# Patient Record
Sex: Female | Born: 1990 | Race: Black or African American | Hispanic: No | Marital: Married | Smoking: Never smoker
Health system: Southern US, Community
[De-identification: ages and names within clinical notes are randomized; demographics above are authoritative.]

## PROBLEM LIST (undated history)

## (undated) HISTORY — PX: NO PAST SURGERIES: SHX2092

---

## 2014-03-18 DIAGNOSIS — B681 Taenia saginata taeniasis: Secondary | ICD-10-CM

## 2014-03-18 HISTORY — DX: Taenia saginata taeniasis: B68.1

## 2014-05-23 ENCOUNTER — Ambulatory Visit: Payer: Medicaid Other | Attending: Family Medicine | Admitting: Family Medicine

## 2014-05-23 ENCOUNTER — Encounter: Payer: Self-pay | Admitting: Family Medicine

## 2014-05-23 VITALS — BP 112/78 | HR 82 | Temp 98.1°F | Resp 16 | Ht 61.0 in | Wt 114.0 lb

## 2014-05-23 DIAGNOSIS — K644 Residual hemorrhoidal skin tags: Secondary | ICD-10-CM | POA: Diagnosis not present

## 2014-05-23 DIAGNOSIS — R51 Headache: Secondary | ICD-10-CM | POA: Insufficient documentation

## 2014-05-23 DIAGNOSIS — Z124 Encounter for screening for malignant neoplasm of cervix: Secondary | ICD-10-CM

## 2014-05-23 DIAGNOSIS — B3749 Other urogenital candidiasis: Secondary | ICD-10-CM | POA: Diagnosis not present

## 2014-05-23 DIAGNOSIS — L03011 Cellulitis of right finger: Secondary | ICD-10-CM | POA: Diagnosis not present

## 2014-05-23 DIAGNOSIS — E559 Vitamin D deficiency, unspecified: Secondary | ICD-10-CM | POA: Insufficient documentation

## 2014-05-23 DIAGNOSIS — J309 Allergic rhinitis, unspecified: Secondary | ICD-10-CM | POA: Diagnosis not present

## 2014-05-23 DIAGNOSIS — A499 Bacterial infection, unspecified: Secondary | ICD-10-CM

## 2014-05-23 DIAGNOSIS — K648 Other hemorrhoids: Secondary | ICD-10-CM

## 2014-05-23 DIAGNOSIS — R42 Dizziness and giddiness: Secondary | ICD-10-CM | POA: Insufficient documentation

## 2014-05-23 DIAGNOSIS — R1030 Lower abdominal pain, unspecified: Secondary | ICD-10-CM

## 2014-05-23 DIAGNOSIS — B9689 Other specified bacterial agents as the cause of diseases classified elsewhere: Secondary | ICD-10-CM

## 2014-05-23 DIAGNOSIS — Z0000 Encounter for general adult medical examination without abnormal findings: Secondary | ICD-10-CM | POA: Diagnosis not present

## 2014-05-23 DIAGNOSIS — N760 Acute vaginitis: Secondary | ICD-10-CM

## 2014-05-23 DIAGNOSIS — Z114 Encounter for screening for human immunodeficiency virus [HIV]: Secondary | ICD-10-CM

## 2014-05-23 DIAGNOSIS — N920 Excessive and frequent menstruation with regular cycle: Secondary | ICD-10-CM | POA: Diagnosis not present

## 2014-05-23 LAB — POCT URINE PREGNANCY: PREG TEST UR: NEGATIVE

## 2014-05-23 MED ORDER — MOMETASONE FUROATE 0.1 % EX OINT: TOPICAL_OINTMENT | Freq: Every day | CUTANEOUS | Status: DC

## 2014-05-23 MED ORDER — MOMETASONE FUROATE 0.1 % EX CREA: 1.0000 "application " | TOPICAL_CREAM | Freq: Every day | CUTANEOUS | Status: DC

## 2014-05-23 MED ORDER — FLUTICASONE PROPIONATE 50 MCG/ACT NA SUSP: 2.0000 | Freq: Every day | NASAL | Status: DC

## 2014-05-23 MED ORDER — NAPROXEN 500 MG PO TABS: 500.0000 mg | ORAL_TABLET | Freq: Two times a day (BID) | ORAL | Status: DC

## 2014-05-23 NOTE — Assessment & Plan Note (Signed)
External hemorrhoid: referral to general surgery for removal.

## 2014-05-23 NOTE — Assessment & Plan Note (Signed)
Chronic paronychia: swelling along nail bed of R thumb and middle finger.  Keep hands as dry as possible and to use gloves for all wet work Topical corticosteroid-

## 2014-05-23 NOTE — Assessment & Plan Note (Addendum)
Dizziness and headaches: I suspect low vit D. Checking level.

## 2014-05-23 NOTE — Assessment & Plan Note (Signed)
Swollen nares consistent with allergic rhinitis: flonase prescribed.

## 2014-05-23 NOTE — Progress Notes (Signed)
   Subjective:    Patient ID: Rachel Lucero, female    DOB: 10/12/1990, 24 y.o.   MRN: [id number] CC: establish care, wellness visit  HPI 24 yo F: Tig[place] interpreter on the phone.   Soc Hx: non smoker OB Hx: G1P1 daughter is 3 yrs and 736 months old Surg hx: negative  Review of Systems  General:  Negative for nexplained weight loss, fever Skin: Negative for new or changing mole, sore that won't heal HEENT: Negative for trouble hearing, trouble seeing, ringing in ears, mouth sores, hoarseness, change in voice, dysphagia. CV:  Negative for chest pain, dyspnea, edema, palpitations Resp: Negative for cough, dyspnea, hemoptysis GI: Positive for abdominal pain with urination during menses only. Negative for nausea, vomiting, diarrhea, constipation, abdominal pain, melena, hematochezia. GU: Negative for dysuria, incontinence, urinary hesitance, hematuria, vaginal or penile discharge, polyuria, sexual difficulty, lumps in testicle or breasts MSK: Positive for low back pains during menses. Negative for muscle cramps or aches, joint pain or swelling Neuro: Positive for dizziness. Negative for headaches, weakness, numbness,  passing out/fainting Psych: Negative for depression, anxiety, memory problems    Objective:   Physical Exam BP 112/78 mmHg  Pulse 82  Temp(Src) 98.1 F (36.7 C) (Oral)  Resp 16  Ht 5\' 1"  (1.549 m)  Wt 114 lb (51.71 kg)  BMI 21.55 kg/m2  SpO2 100%  LMP 04/24/2014  General Appearance:    Alert, cooperative, no distress, appears stated age  Head:    Normocephalic, without obvious abnormality, atraumatic  Eyes:    PERRL, conjunctiva/corneas clear, EOM's intact,    both eyes  Ears:    Normal TM's and external ear canals, both ears  Nose:   Nares normal, septum midline, mucosa normal, no drainage    or sinus tenderness  Throat:   Lips, mucosa, and tongue normal; teeth and gums normal  Neck:   Supple, symmetrical, trachea midline, no adenopathy;    thyroid:  no  enlargement/tenderness/nodules.   Back:     Symmetric, no curvature, ROM normal, no CVA tenderness  Lungs:     Clear to auscultation bilaterally, respirations unlabored  Chest Wall:    No tenderness or deformity   Heart:    Regular rate and rhythm, S1 and S2 normal, no murmur, rub   or gallop  Breast Exam:    No tenderness, masses, or nipple abnormality  Abdomen:     Soft, non-tender, bowel sounds active all four quadrants,    no masses, no organomegaly  Genitalia:    Normal female without lesion, tenderness. Scan bloody discharge   Rectal:    External hemorrhoid, non thrombosed.   Extremities:   Extremities normal, atraumatic, no cyanosis or edema. Soft tissue swelling around nail bed of thumb and middle finger of R hand.   Pulses:   2+ and symmetric all extremities  Skin:   Skin color, texture, turgor normal, no rashes or lesions  Lymph nodes:   Cervical, supraclavicular, and axillary nodes normal  Neurologic:   CNII-XII intact, normal strength, sensation and reflexes    throughout  U preg: negative UA: large Hgb (patient with menstrual period starting soon)    Assessment & Plan:

## 2014-05-23 NOTE — Assessment & Plan Note (Signed)
Pap done today  

## 2014-05-23 NOTE — Patient Instructions (Addendum)
Rachel Lucero,  Thank you for coming in today. It was a pleasure meeting you. I look forward to being your primary doctor.   1. Pain with menses: take naproxen as prescribed  2. Dizziness and headaches: I suspect low vit D. Checking level.  3. Swollen nares consistent with allergic rhinitis: flonase prescribed.  4. Chronic paronychia: swelling along nail bed of R thumb and middle finger.  Keep hands as dry as possible and to use gloves for all wet work Topical corticosteroid-   5. External hemorrhoid: referral to general surgery for removal.   F/u in 3 months for dizziness and chronic paronychia  Dr. Armen PickupFunches   Paronychia  Paronychia is an infection of the skin caused by germs. It happens by the fingernail or toenail. You can avoid it by not:  Pulling on hangnails.  Nail biting.  Thumb sucking.  Cutting fingernails and toenails too short.  Cutting the skin at the base and sides of the fingernail or toenail (cuticle). HOME CARE  Keep the fingers or toes very dry. Put rubber gloves over cotton gloves when putting hands in water.  Keep the wound clean and bandaged (dressed) as told by your doctor.  Soak the fingers or toes in warm water for 15 to 20 minutes. Soak them 3 to 4 times per day for germ infections. Fungal infections are difficult to treat. Fungal infections often require treatment for a long time.  Only take medicine as told by your doctor. GET HELP RIGHT AWAY IF:   You have redness, puffiness (swelling), or pain that gets worse.  You see yellowish-white fluid (pus) coming from the wound.  You have a fever.  You have a bad smell coming from the wound or bandage. MAKE SURE YOU:  Understand these instructions.  Will watch your condition.  Will get help if you are not doing well or get worse. Document Released: 02/20/2009 Document Revised: 05/27/2011 Document Reviewed: 02/20/2009 [facility] Patient Information 2015 [employer] [place]. This information  is not intended to replace advice given to you by your health care provider. Make sure you discuss any questions you have with your health care provider.

## 2014-05-23 NOTE — Assessment & Plan Note (Signed)
Screening HIV ordered  

## 2014-05-23 NOTE — Progress Notes (Signed)
Used [employer] [place] [phone]#[id number] Establish Care Complaining of Dizziness, abdominal pain and pain with urination. Vaginal discharge with out odor. Sx stated 8 month ago No sexually active

## 2014-05-23 NOTE — Assessment & Plan Note (Signed)
Pain with menses: take naproxen as prescribed

## 2014-05-24 DIAGNOSIS — E559 Vitamin D deficiency, unspecified: Secondary | ICD-10-CM | POA: Insufficient documentation

## 2014-05-24 DIAGNOSIS — N760 Acute vaginitis: Secondary | ICD-10-CM

## 2014-05-24 DIAGNOSIS — B9689 Other specified bacterial agents as the cause of diseases classified elsewhere: Secondary | ICD-10-CM | POA: Insufficient documentation

## 2014-05-24 LAB — CBC
HEMATOCRIT: 42.4 % (ref 36.0–46.0)
HEMOGLOBIN: 14 g/dL (ref 12.0–15.0)
MCH: 29.5 pg (ref 26.0–34.0)
MCHC: 33 g/dL (ref 30.0–36.0)
MCV: 89.3 fL (ref 78.0–100.0)
MPV: 11.1 fL (ref 8.6–12.4)
Platelets: 231 10*3/uL (ref 150–400)
RBC: 4.75 MIL/uL (ref 3.87–5.11)
RDW: 13.1 % (ref 11.5–15.5)
WBC: 3.3 10*3/uL — AB (ref 4.0–10.5)

## 2014-05-24 LAB — COMPLETE METABOLIC PANEL WITH GFR
ALBUMIN: 4.3 g/dL (ref 3.5–5.2)
ALK PHOS: 76 U/L (ref 39–117)
ALT: 8 U/L (ref 0–35)
AST: 13 U/L (ref 0–37)
BILIRUBIN TOTAL: 0.6 mg/dL (ref 0.2–1.2)
BUN: 12 mg/dL (ref 6–23)
CO2: 23 mEq/L (ref 19–32)
Calcium: 9.2 mg/dL (ref 8.4–10.5)
Chloride: 104 mEq/L (ref 96–112)
Creat: 0.53 mg/dL (ref 0.50–1.10)
GFR, Est African American: >89 mL/min
GLUCOSE: 81 mg/dL (ref 70–99)
POTASSIUM: 4.3 meq/L (ref 3.5–5.3)
Sodium: 138 mEq/L (ref 135–145)
Total Protein: 7.5 g/dL (ref 6.0–8.3)

## 2014-05-24 LAB — CYTOLOGY - PAP

## 2014-05-24 LAB — CERVICOVAGINAL ANCILLARY ONLY
Chlamydia: NEGATIVE
Neisseria Gonorrhea: NEGATIVE
WET PREP (BD AFFIRM): NEGATIVE
WET PREP (BD AFFIRM): POSITIVE — AB
Wet Prep (BD Affirm): NEGATIVE

## 2014-05-24 LAB — HIV ANTIBODY (ROUTINE TESTING W REFLEX): HIV 1&2 Ab, 4th Generation: NONREACTIVE

## 2014-05-24 LAB — VITAMIN D 25 HYDROXY (VIT D DEFICIENCY, FRACTURES): Vit D, 25-Hydroxy: 14 ng/mL — ABNORMAL LOW (ref 30–100)

## 2014-05-24 LAB — TSH: TSH: 0.932 u[IU]/mL (ref 0.350–4.500)

## 2014-05-24 MED ORDER — METRONIDAZOLE 500 MG PO TABS: 500.0000 mg | ORAL_TABLET | Freq: Two times a day (BID) | ORAL | Status: DC

## 2014-05-24 MED ORDER — VITAMIN D (ERGOCALCIFEROL) 1.25 MG (50000 UNIT) PO CAPS: 50000.0000 [IU] | ORAL_CAPSULE | ORAL | Status: DC

## 2014-05-24 MED ORDER — FLUCONAZOLE 150 MG PO TABS: 150.0000 mg | ORAL_TABLET | Freq: Once | ORAL | Status: DC

## 2014-05-24 NOTE — Addendum Note (Signed)
Addended by: Dessa PhiFUNCHES, Erynne Kealey on: 05/24/2014 03:53 PM   Modules accepted: Orders

## 2014-05-24 NOTE — Assessment & Plan Note (Signed)
A: vit D deficiency P: oral vit D replacement

## 2014-05-24 NOTE — Addendum Note (Signed)
Addended by: Dessa PhiFUNCHES, Ashely Goosby on: 05/24/2014 08:27 AM   Modules accepted: Orders

## 2014-05-24 NOTE — Assessment & Plan Note (Signed)
A: BV on wet prep P: treat with flagyl and diflucan

## 2014-05-25 LAB — URINE CULTURE
Colony Count: NO GROWTH
Organism ID, Bacteria: NO GROWTH

## 2014-05-26 ENCOUNTER — Telehealth: Payer: Self-pay | Admitting: *Deleted

## 2014-05-26 NOTE — Telephone Encounter (Signed)
-----   Message from Dessa PhiJosalyn Funches, MD sent at 05/24/2014  5:02 PM EST ----- Negative pap, repeat in 3 years

## 2014-05-26 NOTE — Telephone Encounter (Signed)
[employer] [place] [phone]#[id number] Unable to contact pt. Mail box is full

## 2014-05-26 NOTE — Telephone Encounter (Signed)
-----   Message from Dessa PhiJosalyn Funches, MD sent at 05/24/2014  8:25 AM EST ----- Vitamin D deficiency as suspected. Will replace.  Normal CBC, CMP, TSH.  Screening HIV negative.

## 2014-05-26 NOTE — Telephone Encounter (Signed)
-----   Message from Dessa PhiJosalyn Funches, MD sent at 05/24/2014  3:51 PM EST ----- BV on wet prep, otherwise negative.  Will treat.

## 2014-05-31 ENCOUNTER — Other Ambulatory Visit: Payer: Self-pay | Admitting: Infectious Disease

## 2014-05-31 ENCOUNTER — Ambulatory Visit
Admission: RE | Admit: 2014-05-31 | Discharge: 2014-05-31 | Disposition: A | Discharge status: Home / Self Care | Payer: No Typology Code available for payment source | Source: Ambulatory Visit | Attending: Infectious Disease | Admitting: Infectious Disease

## 2014-05-31 DIAGNOSIS — Z139 Encounter for screening, unspecified: Secondary | ICD-10-CM

## 2014-08-02 ENCOUNTER — Other Ambulatory Visit: Payer: Self-pay | Admitting: Surgery

## 2014-08-04 ENCOUNTER — Encounter: Payer: Self-pay | Admitting: Internal Medicine

## 2014-08-04 ENCOUNTER — Ambulatory Visit (INDEPENDENT_AMBULATORY_CARE_PROVIDER_SITE_OTHER): Payer: Medicaid Other | Admitting: Internal Medicine

## 2014-08-04 ENCOUNTER — Telehealth: Payer: Self-pay | Admitting: *Deleted

## 2014-08-04 VITALS — BP 108/74 | HR 83 | Temp 97.9°F | Ht 61.0 in | Wt 110.0 lb

## 2014-08-04 DIAGNOSIS — B681 Taenia saginata taeniasis: Secondary | ICD-10-CM | POA: Diagnosis not present

## 2014-08-04 DIAGNOSIS — R7611 Nonspecific reaction to tuberculin skin test without active tuberculosis: Secondary | ICD-10-CM

## 2014-08-04 DIAGNOSIS — Z227 Latent tuberculosis: Secondary | ICD-10-CM

## 2014-08-04 LAB — CBC WITH DIFFERENTIAL/PLATELET
Basophils Absolute: 0 10*3/uL (ref 0.0–0.1)
Basophils Relative: 0 % (ref 0–1)
Eosinophils Absolute: 0.1 10*3/uL (ref 0.0–0.7)
Eosinophils Relative: 2 % (ref 0–5)
HCT: 39.6 % (ref 36.0–46.0)
Hemoglobin: 13.2 g/dL (ref 12.0–15.0)
LYMPHS ABS: 1.5 10*3/uL (ref 0.7–4.0)
LYMPHS PCT: 47 % — AB (ref 12–46)
MCH: 28.8 pg (ref 26.0–34.0)
MCHC: 33.3 g/dL (ref 30.0–36.0)
MCV: 86.3 fL (ref 78.0–100.0)
MPV: 10.4 fL (ref 8.6–12.4)
Monocytes Absolute: 0.4 10*3/uL (ref 0.1–1.0)
Monocytes Relative: 14 % — ABNORMAL HIGH (ref 3–12)
NEUTROS ABS: 1.1 10*3/uL — AB (ref 1.7–7.7)
NEUTROS PCT: 37 % — AB (ref 43–77)
Platelets: 243 10*3/uL (ref 150–400)
RBC: 4.59 MIL/uL (ref 3.87–5.11)
RDW: 13.5 % (ref 11.5–15.5)
WBC: 3.1 10*3/uL — AB (ref 4.0–10.5)

## 2014-08-04 LAB — COMPREHENSIVE METABOLIC PANEL
ALT: 15 U/L (ref 0–35)
AST: 17 U/L (ref 0–37)
Albumin: 4 g/dL (ref 3.5–5.2)
Alkaline Phosphatase: 84 U/L (ref 39–117)
BILIRUBIN TOTAL: 0.9 mg/dL (ref 0.2–1.2)
BUN: 10 mg/dL (ref 6–23)
CALCIUM: 8.9 mg/dL (ref 8.4–10.5)
CHLORIDE: 104 meq/L (ref 96–112)
CO2: 24 meq/L (ref 19–32)
Creat: 0.51 mg/dL (ref 0.50–1.10)
Glucose, Bld: 89 mg/dL (ref 70–99)
Potassium: 4.3 mEq/L (ref 3.5–5.3)
SODIUM: 138 meq/L (ref 135–145)
Total Protein: 7.2 g/dL (ref 6.0–8.3)

## 2014-08-04 MED ORDER — RIFAMPIN 300 MG PO CAPS: 600.0000 mg | ORAL_CAPSULE | Freq: Every day | ORAL | Status: DC

## 2014-08-04 MED ORDER — PRAZIQUANTEL 600 MG PO TABS: 600.0000 mg | ORAL_TABLET | Freq: Once | ORAL | Status: DC

## 2014-08-04 NOTE — Telephone Encounter (Signed)
-----   Message from Gardiner Barefootobert W Comer, MD sent at 08/03/2014  4:05 PM EDT ----- This patient needs to come in for evaluation/treatment of her worm (new pt).  Can you see if she can come in tomorrow at the end of my morning clinic.  She will need the interpreter phone I think.  If not, next week with Drue SecondSnider sometime.  thanks

## 2014-08-04 NOTE — Telephone Encounter (Signed)
Called patient through [employer] [phone] Diamantina Providence([place]) and offered patient appointment today at 11:30 AM. She wrote down the address to give to the bus driver. She accepted appointment; she said she would need to take 2 buses to get here. Told patient we can give her a bus pass to get home. Wendall MolaJacqueline Darcie Mellone CMA

## 2014-08-04 NOTE — Progress Notes (Signed)
   Subjective:    Patient ID: Rachel Lucero, female    DOB: 11/27/1990, 24 y.o.   MRN: [id number]  HPI She comes in for evaluation as a new patient. She was recently seen by surgery, Dr. gross, who was evaluating her for hemorrhoidectomy. At the time, he noted a large parasitic worm coming out of her rectum. 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  She has had no diarrhea, no rash, no weight loss. She does not recall any history of parasites. 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 She has no complaints today.   Review of Systems  Constitutional: Negative for fever, chills, activity change and appetite change.  HENT: Negative for trouble swallowing.   Eyes: Negative for visual disturbance.  Cardiovascular: Negative for chest pain.  Gastrointestinal: Negative for nausea, abdominal pain and diarrhea.  Endocrine: Negative for polyuria.  Genitourinary: Negative for vaginal pain.  Skin: Negative for rash.  Neurological: Negative for dizziness, light-headedness and headaches.       Objective:   Physical Exam  Constitutional: She appears well-developed and well-nourished. No distress.  HENT:  Mouth/Throat: No oropharyngeal exudate.  Eyes: No scleral icterus.  Cardiovascular: Normal rate, regular rhythm and normal heart sounds.   No murmur heard. Pulmonary/Chest: Effort normal and breath sounds normal. No respiratory distress. She has no wheezes.  Abdominal: Soft. Bowel sounds are normal. She exhibits no distension. There is no tenderness.  Musculoskeletal: She exhibits no edema.  Lymphadenopathy:    She has no cervical adenopathy.    She has no axillary adenopathy.       Right: No supraclavicular adenopathy present.       Left: No supraclavicular adenopathy present.  Skin: No rash noted.  Psychiatric: She has a normal mood and affect.          Assessment & Plan:

## 2014-08-04 NOTE — Assessment & Plan Note (Signed)
I will have her check (parasites for better identification of this and other parasites. She can then start the medication with praziquantel 1 dose.  I will also check for eosinophilia and strongyloidiasis and for concern for helminths.  I will have her return in 2 weeks.

## 2014-08-06 LAB — STRONGYLOIDES ANTIBODY: Strongyloides IgG Antibody, ELISA: NEGATIVE

## 2014-08-10 ENCOUNTER — Telehealth: Payer: Self-pay | Admitting: *Deleted

## 2014-08-10 ENCOUNTER — Other Ambulatory Visit: Payer: No Typology Code available for payment source

## 2014-08-10 DIAGNOSIS — B681 Taenia saginata taeniasis: Secondary | ICD-10-CM

## 2014-08-10 NOTE — Telephone Encounter (Signed)
Was not able to reach patient until today and she was actually on the way to drop off the stool sample. Advised to bring it in and to continue to take the medication that was prescribed. Per Dr. Luciana Axeomer if she had already gotten the stool sample it would be processed. Rachel Lucero

## 2014-08-10 NOTE — Telephone Encounter (Signed)
-----   Message from Gardiner Barefootobert W Comer, MD sent at 08/09/2014 11:34 AM EDT ----- Please call and she does not need to do O and P since once was done by the surgeon and is positive.  She should go ahead and take the praziquantel.  thanks

## 2014-08-11 LAB — OVA AND PARASITE EXAMINATION

## 2014-08-25 ENCOUNTER — Ambulatory Visit (INDEPENDENT_AMBULATORY_CARE_PROVIDER_SITE_OTHER): Payer: Medicaid Other | Admitting: Internal Medicine

## 2014-08-25 ENCOUNTER — Encounter: Payer: Self-pay | Admitting: Internal Medicine

## 2014-08-25 VITALS — BP 90/58 | HR 70 | Temp 98.1°F | Wt 109.0 lb

## 2014-08-25 DIAGNOSIS — B681 Taenia saginata taeniasis: Secondary | ICD-10-CM | POA: Diagnosis present

## 2014-08-25 NOTE — Assessment & Plan Note (Signed)
He has been treated and can proceed with her surgery again now. She can return on a when necessary basis.

## 2014-08-25 NOTE — Progress Notes (Signed)
   Subjective:    Patient ID: Rachel Lucero, female    DOB: 1990-04-04, 24 y.o.   MRN: [id number]  HPI She is here for follow-up of tapeworm. 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 She was given treatment treatment with this with praziquantel which she took and tolerated well. No issues now. No fever no chills.   Review of Systems  Constitutional: Negative for fever, chills and fatigue.  Gastrointestinal: Negative for nausea and diarrhea.  Skin: Negative for rash.       Objective:   Physical Exam  Constitutional: She appears well-developed and well-nourished. No distress.          Assessment & Plan:

## 2014-08-26 ENCOUNTER — Other Ambulatory Visit: Payer: Self-pay | Admitting: Surgery

## 2014-08-26 NOTE — H&P (Signed)
Rachel Lucero 08/02/2014 2:19 PM Location: [facility] Patient #: [id number] DOB: [birth year] Single / Language: Shon Hough / Race: Black or African American Female  Patient Care Team: Dessa Phi, MD as PCP - General (Family Medicine)   History of Present Illness Rachel Sportsman MD; 08/02/2014 5:57 PM) The patient is a 24 year old female who presents with hemorrhoids. Patient sent by her primary care physician with complaints of full external hemorrhoid. She is from [place] in [place]. Speaks Tigirian only. Interpreter here. Patient notes a 3 year history of pain and swelling around the anus. Thinks that it is a hemorrhoid. Especially worse with spicy more intense food. She has a bowel movement every 3 days. She's never had any prior surgery or intervention. Not really tried any medications. Denies any history of Crohn's or ulcerative colitis. No history of bowel issues. Never had any prior surgery. Discussed the hemorrhoid with her primary care physician. Surgical consultation requested.   Other Problems Rachel Lucero, CMA; 08/02/2014 2:19 PM) Gastroesophageal Reflux Disease Hemorrhoids  Past Surgical History Rachel Lucero, CMA; 08/02/2014 2:19 PM) No pertinent past surgical history  Diagnostic Studies History Rachel Lucero, CMA; 08/02/2014 2:19 PM) Colonoscopy never Mammogram never  Allergies Rachel Lucero, CMA; 08/02/2014 2:22 PM) No Known Drug Allergies05/17/2016  Medication History (Rachel Lucero, CMA; 08/02/2014 2:27 PM) Elocon (0.1% Cream, External) Active. Diflucan (  Tablet, Oral) Active. Flonase Allergy Relief (50MCG/ACT Suspension, Nasal) Active. Flagyl (  Tablet, Oral) Active. Naproxen (  Tablet, Oral) Active. Drisdol (50000UNIT Capsule, Oral) Active. Medications Reconciled  Social History Rachel Lucero, CMA; 08/02/2014 2:19 PM) Caffeine use Carbonated beverages, Coffee, Tea. No drug  use Tobacco use Never smoker.  Pregnancy / Birth History Rachel Lucero, CMA; 08/02/2014 2:19 PM) Age at menarche 13 years. Gravida 1 Maternal age 58-25 Para 1 Regular periods  Review of Systems Rachel Lucero CMA; 08/02/2014 2:19 PM) General Not Present- Appetite Loss, Chills, Fatigue, Fever, Night Sweats, Weight Gain and Weight Loss. Skin Present- Hives. Not Present- Change in Wart/Mole, Dryness, Jaundice, New Lesions, Non-Healing Wounds, Rash and Ulcer. HEENT Not Present- Earache, Hearing Loss, Hoarseness, Nose Bleed, Oral Ulcers, Ringing in the Ears, Seasonal Allergies, Sinus Pain, Sore Throat, Visual Disturbances, Wears glasses/contact lenses and Yellow Eyes. Respiratory Not Present- Bloody sputum, Chronic Cough, Difficulty Breathing, Snoring and Wheezing. Breast Not Present- Breast Mass, Breast Pain, Nipple Discharge and Skin Changes. Cardiovascular Not Present- Chest Pain, Difficulty Breathing Lying Down, Leg Cramps, Palpitations, Rapid Heart Rate, Shortness of Breath and Swelling of Extremities. Gastrointestinal Present- Hemorrhoids. Not Present- Abdominal Pain, Bloating, Bloody Stool, Change in Bowel Habits, Chronic diarrhea, Constipation, Difficulty Swallowing, Excessive gas, Gets full quickly at meals, Indigestion, Nausea, Rectal Pain and Vomiting. Female Genitourinary Not Present- Frequency, Nocturia, Painful Urination, Pelvic Pain and Urgency. Musculoskeletal Not Present- Back Pain, Joint Pain, Joint Stiffness, Muscle Pain, Muscle Weakness and Swelling of Extremities. Neurological Not Present- Decreased Memory, Fainting, Headaches, Numbness, Seizures, Tingling, Tremor, Trouble walking and Weakness. Psychiatric Not Present- Anxiety, Bipolar, Change in Sleep Pattern, Depression, Fearful and Frequent crying. Endocrine Not Present- Cold Intolerance, Excessive Hunger, Hair Changes, Heat Intolerance, Hot flashes and New Diabetes. Hematology Not Present- Easy Bruising, Excessive  bleeding, Gland problems, HIV and Persistent Infections.   Vitals (Rachel Lucero CMA; 08/02/2014 2:21 PM) 08/02/2014 2:21 PM Weight: 113 lb Height: 61in Body Surface Area: 1.49 m Body Mass Index: 21.35 kg/m Pulse: 88 (Regular)  BP: 104/70 (Sitting, Left Arm, Standard)    Physical Exam Rachel Sportsman MD; 08/02/2014 5:52 PM) General Mental Status-Alert. General  Appearance-Not in acute distress, Not Sickly. Orientation-Oriented X3. Hydration-Well hydrated. Voice-Normal.  Integumentary Global Assessment Upon inspection and palpation of skin surfaces of the - Axillae: non-tender, no inflammation or ulceration, no drainage. and Distribution of scalp and body hair is normal. General Characteristics Temperature - normal warmth is noted.  Head and Neck Head-normocephalic, atraumatic with no lesions or palpable masses. Face Global Assessment - atraumatic, no absence of expression. Neck Global Assessment - no abnormal movements, no bruit auscultated on the right, no bruit auscultated on the left, no decreased range of motion, non-tender. Trachea-midline. Thyroid Gland Characteristics - non-tender.  Eye Eyeball - Left-Extraocular movements intact, No Nystagmus. Eyeball - Right-Extraocular movements intact, No Nystagmus. Cornea - Left-No Hazy. Cornea - Right-No Hazy. Sclera/Conjunctiva - Left-No scleral icterus, No Discharge. Sclera/Conjunctiva - Right-No scleral icterus, No Discharge. Pupil - Left-Direct reaction to light normal. Pupil - Right-Direct reaction to light normal.  ENMT Ears Pinna - Left - no drainage observed, no generalized tenderness observed. Right - no drainage observed, no generalized tenderness observed. Nose and Sinuses External Inspection of the Nose - no destructive lesion observed. Inspection of the nares - Left - quiet respiration. Right - quiet respiration. Mouth and Throat Lips - Upper Lip - no fissures  observed, no pallor noted. Lower Lip - no fissures observed, no pallor noted. Nasopharynx - no discharge present. Oral Cavity/Oropharynx - Tongue - no dryness observed. Oral Mucosa - no cyanosis observed. Hypopharynx - no evidence of airway distress observed.  Chest and Lung Exam Inspection Movements - Normal and Symmetrical. Accessory muscles - No use of accessory muscles in breathing. Palpation Palpation of the chest reveals - Non-tender. Auscultation Breath sounds - Normal and Clear.  Cardiovascular Auscultation Rhythm - Regular. Murmurs & Other Heart Sounds - Auscultation of the heart reveals - No Murmurs and No Systolic Clicks.  Abdomen Inspection Inspection of the abdomen reveals - No Visible peristalsis and No Abnormal pulsations. Umbilicus - No Bleeding, No Urine drainage. Palpation/Percussion Palpation and Percussion of the abdomen reveal - Soft, Non Tender, No Rebound tenderness, No Rigidity (guarding) and No Cutaneous hyperesthesia. Note: Soft & flat. NT   Female Genitourinary Sexual Maturity Tanner 5 - Adult hair pattern. Note: No vaginal bleeding nor discharge   Rectal Note: Perianal skin clean. Obvious RIGHT anterior internal hemorrhoid chronically prolapsed with external component. Normal sphincter tone. No fissure. No fistula.  In rectal vault bone white 2-6mm wide linear figures consistent with worms. Fragments of worms and stool extracted.   Peripheral Vascular Upper Extremity Inspection - Left - No Cyanotic nailbeds, Not Ischemic. Right - No Cyanotic nailbeds, Not Ischemic.  Neurologic Neurologic evaluation reveals -normal attention span and ability to concentrate, able to name objects and repeat phrases. Appropriate fund of knowledge , normal sensation and normal coordination. Mental Status Affect - not angry, not paranoid. Cranial Nerves-Normal Bilaterally. Gait-Normal.  Neuropsychiatric Mental status exam performed with findings  of-able to articulate well with normal speech/language, rate, volume and coherence, thought content normal with ability to perform basic computations and apply abstract reasoning and no evidence of hallucinations, delusions, obsessions or homicidal/suicidal ideation.  Musculoskeletal Global Assessment Spine, Ribs and Pelvis - no instability, subluxation or laxity. Right Upper Extremity - no instability, subluxation or laxity.  Lymphatic Head & Neck  General Head & Neck Lymphatics: Bilateral - Description - No Localized lymphadenopathy. Axillary  General Axillary Region: Bilateral - Description - No Localized lymphadenopathy. Femoral & Inguinal  Generalized Femoral & Inguinal Lymphatics: Left - Description - No Localized lymphadenopathy.  Right - Description - No Localized lymphadenopathy.    Assessment & Plan Rachel Sportsman MD; 08/02/2014 5:56 PM) EXTERNAL HEMORRHOIDS WITH COMPLICATION (455.5  K64.8) Impression: I think because it is causing pain and discomfort, she would benefit from removal. This will require outpatient surgery. She is interested in proceeding at some point.  However, I'm worried that she has a warm bacterial infection. We'll send stool off for parasite identification. Discussed at length with Dr. Larose Hires with pathology, Dr. Paulette Blanch Dam and Dr. Jerolyn Center with infectious disease. Once the appropriate parasite is identified and treated in infection resolves, then plan surgery. Current Plans  Schedule for Surgery after parasitic infestation cleared under infectious disease supervision Written instructions provided Pt Education - [facility] Hemorrhoids (Michon Kaczmarek) Pt Education - [facility] Rectal Surgery HCI (Freman Lapage): discussed with patient and provided information. 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 Apparently patient confesses to passing what seemed to be worms  for the past month only. Does not seem to correlate with her being on the contrary for over 6 months. I called and discussed with infectious disease. Drs. [employer] and [place]. Discuss with Drs. Hilliard and Luisa Hart with pathology. Stool and worm fragment specimen sent to microbiology urgently per infectious disease and pathology recommendations. Initial impression favors proglottid / tapeworm.  However, they would like more specimen. Arranging stool for ova and parasites in the next day or so.  She completed antibiotic treatment under ID supervision.   Infectious diseases set up appointment with Dr. Merceda Elks in a few days, given his expertise and tropical parasites. CHRONIC CONSTIPATION (564.00  K59.09) Impression: Suspect constipation is major contributor to hemorrhoid formation. This needs to be corrected.  Would benefit from being on some type of fiber bowel regimen. Handouts given. Current Plans Pt Education - [facility] Good Bowel Health (Rubi Tooley)  Rachel Lucero, M.D., F.A.C.S. Gastrointestinal and Minimally Invasive Surgery [facility] [address] [place], [place] [address] [phone] Main / Paging

## 2014-09-06 ENCOUNTER — Encounter: Payer: Self-pay | Admitting: Family Medicine

## 2014-09-06 ENCOUNTER — Ambulatory Visit: Payer: Medicaid Other | Attending: Family Medicine | Admitting: Family Medicine

## 2014-09-06 VITALS — BP 102/63 | HR 82 | Temp 98.6°F | Resp 16 | Ht 61.0 in | Wt 110.0 lb

## 2014-09-06 DIAGNOSIS — E559 Vitamin D deficiency, unspecified: Secondary | ICD-10-CM | POA: Diagnosis not present

## 2014-09-06 DIAGNOSIS — L03011 Cellulitis of right finger: Secondary | ICD-10-CM

## 2014-09-06 MED ORDER — MOMETASONE FUROATE 0.1 % EX CREA: 1.0000 "application " | TOPICAL_CREAM | Freq: Every day | CUTANEOUS | Status: DC

## 2014-09-06 MED ORDER — VITAMIN D (ERGOCALCIFEROL) 1.25 MG (50000 UNIT) PO CAPS: 50000.0000 [IU] | ORAL_CAPSULE | ORAL | Status: DC

## 2014-09-06 NOTE — Assessment & Plan Note (Signed)
Chronic paronychia R hand: Mometasone cream

## 2014-09-06 NOTE — Progress Notes (Signed)
   Subjective:    Patient ID: Rachel Lucero, female    DOB: 30-Aug-1990, 24 y.o.   MRN: [id number] CC: f/u chronic paronychia, vit D deficiency  HPI  24 yo F:  1. Chronic paronychia: no change since last visit. Has not started steroid cream. No drainage. Fingers are not wet often. No erythema.   2. Vit D def:  Was not aware that she was deficient. Has not started vit D.   Soc Hx: non smoker  Review of Systems  Constitutional: Negative for fever and chills.  Skin: Positive for rash.       Objective:   Physical Exam BP 102/63 mmHg  Pulse 82  Temp(Src) 98.6 F (37 C) (Oral)  Resp 16  Ht 5\' 1"  (1.549 m)  Wt 110 lb (49.896 kg)  BMI 20.80 kg/m2  SpO2 99%  LMP 08/11/2014 (Approximate) General appearance: alert, cooperative and no distress  Extremities normal, atraumatic, no cyanosis or edema. Soft tissue swelling and skin hyperpigmentation around nail bed of thumb and middle finger of R hand.        Assessment & Plan:

## 2014-09-06 NOTE — Assessment & Plan Note (Signed)
  2. Vit D deficiency: Vit D for 8 week total, one tab weekly

## 2014-09-06 NOTE — Patient Instructions (Signed)
Rachel Lucero,  Thank you for coming back today  1. Chronic paronychia R hand: Mometasone cream   2. Vit D deficiency: Vit D for 8 week total, one tab weekly  F/u in 12 weeks for recheck of fingers and repeat vit D  Dr. Armen Pickup

## 2014-09-06 NOTE — Progress Notes (Signed)
Used [facility] interpreted [place]ian # [id number] infection on 3rd and 1st finger

## 2015-01-02 ENCOUNTER — Encounter: Payer: Self-pay | Admitting: Family Medicine

## 2015-01-02 ENCOUNTER — Ambulatory Visit: Payer: Medicaid Other | Attending: Family Medicine | Admitting: Family Medicine

## 2015-01-02 VITALS — BP 108/67 | HR 84 | Temp 98.0°F | Resp 16 | Ht 61.0 in | Wt 112.0 lb

## 2015-01-02 DIAGNOSIS — Z23 Encounter for immunization: Secondary | ICD-10-CM | POA: Insufficient documentation

## 2015-01-02 DIAGNOSIS — Z0000 Encounter for general adult medical examination without abnormal findings: Secondary | ICD-10-CM

## 2015-01-02 DIAGNOSIS — H6092 Unspecified otitis externa, left ear: Secondary | ICD-10-CM | POA: Diagnosis not present

## 2015-01-02 DIAGNOSIS — H9202 Otalgia, left ear: Secondary | ICD-10-CM | POA: Diagnosis present

## 2015-01-02 MED ORDER — CIPROFLOXACIN-DEXAMETHASONE 0.3-0.1 % OT SUSP: 4.0000 [drp] | Freq: Two times a day (BID) | OTIC | Status: DC

## 2015-01-02 NOTE — Progress Notes (Signed)
[facility] Interpret[facility] [place] [phone]#[id number] Pt with interpreted trigrinian [place] C/C lt ear pain x 2 week with bloody discharge  Pain scale 10  Used ear relief ear drops OTC

## 2015-01-02 NOTE — Patient Instructions (Signed)
Rachel Lucero was seen today for ear pain.  Diagnoses and all orders for this visit:  Healthcare maintenance -     Flu Vaccine QUAD 36+ mos IM  Otitis externa of left ear -     ciprofloxacin-dexamethasone (CIPRODEX) otic suspension; Place 4 drops into the left ear 2 (two) times daily. For 7 days   Otitis Externa Otitis externa is a germ infection in the outer ear. The outer ear is the area from the eardrum to the outside of the ear. Otitis externa is sometimes called "swimmer's ear." HOME CARE  Put drops in the ear as told by your doctor.  Only take medicine as told by your doctor.  If you have diabetes, your doctor may give you more directions. Follow your doctor's directions.  Keep all doctor visits as told. To avoid another infection:  Keep your ear dry. Use the corner of a towel to dry your ear after swimming or bathing.  Avoid scratching or putting things inside your ear.  Avoid swimming in lakes, dirty water, or pools that use a chemical called chlorine poorly.  You may use ear drops after swimming. Combine equal amounts of white vinegar and alcohol in a bottle. Put 3 or 4 drops in each ear. GET HELP IF:   You have a fever.  Your ear is still red, puffy (swollen), or painful after 3 days.  You still have yellowish-white fluid (pus) coming from the ear after 3 days.  Your redness, puffiness, or pain gets worse.  You have a really bad headache.  You have redness, puffiness, pain, or tenderness behind your ear. MAKE SURE YOU:   Understand these instructions.  Will watch your condition.  Will get help right away if you are not doing well or get worse.   This information is not intended to replace advice given to you by your health care provider. Make sure you discuss any questions you have with your health care provider.   Document Released: 08/21/2007 Document Revised: 03/25/2014 Document Reviewed: 03/21/2011 Elsevier Interactive Patient Education [employer]2016 Elsevier  Inc.

## 2015-01-02 NOTE — Progress Notes (Signed)
Patient ID: Rachel Lucero, female   DOB: 06/15/1990, 24 y.o.   MRN: [id number]   Subjective:  Patient ID: Rachel DickerEmebat Bitton, female    DOB: 03/30/1990  Age: 24 y.o. MRN: [id number]  Tigrinian interpreter phone use  CC: Ear Pain   HPI Ena Legan presents for   1. Left ear pain: x 2 weeks. Wearing ear plugs at work. Works at [employer]. No fever. Pain is L posterior ear to neck. OTC ear drops help.   Social History  Substance Use Topics  . Smoking status: Never Smoker   . Smokeless tobacco: Not on file  . Alcohol Use: No    Outpatient Prescriptions Prior to Visit  Medication Sig Dispense Refill  . mometasone (ELOCON) 0.1 % cream Apply 1 application topically daily. To thumb and third finger on R hand for 3 weeks (Patient not taking: Reported on 01/02/2015) 45 g 0  . rifampin (RIFADIN) 300 MG capsule Take 2 capsules (600 mg total) by mouth daily. (Patient not taking: Reported on 01/02/2015) 60 capsule 0  . Vitamin D, Ergocalciferol, (DRISDOL) 50000 UNITS CAPS capsule Take 1 capsule (50,000 Units total) by mouth every 7 (seven) days. For 8 weeks (Patient not taking: Reported on 01/02/2015) 8 capsule 0   No facility-administered medications prior to visit.    ROS Review of Systems  Constitutional: Negative for fever and chills.  HENT: Positive for ear pain.   Eyes: Negative for visual disturbance.  Respiratory: Negative for shortness of breath.   Cardiovascular: Negative for chest pain.  Gastrointestinal: Negative for abdominal pain and blood in stool.  Musculoskeletal: Negative for back pain and arthralgias.  Skin: Negative for rash.  Allergic/Immunologic: Negative for immunocompromised state.  Hematological: Negative for adenopathy. Does not bruise/bleed easily.  Psychiatric/Behavioral: Negative for suicidal ideas and dysphoric mood.    Objective:  BP 108/67 mmHg  Pulse 84  Temp(Src) 98 F (36.7 C) (Oral)  Resp 16  Ht 5\' 1"  (1.549 m)  Wt 112 lb  (50.803 kg)  BMI 21.17 kg/m2  SpO2 100%  LMP 12/12/2014  BP/Weight 01/02/2015 09/06/2014 08/25/2014  Systolic BP 108 102 90  Diastolic BP 67 63 58  Wt. (Lbs) 112 110 109  BMI 21.17 20.8 20.61    Physical Exam  Constitutional: She is oriented to person, place, and time. She appears well-developed and well-nourished. No distress.  HENT:  Head: Normocephalic and atraumatic.  Right Ear: External ear and ear canal normal.  Left Ear: No lacerations. There is swelling and tenderness. No drainage. No foreign bodies. No mastoid tenderness. Tympanic membrane is not injected, not perforated, not erythematous, not retracted and not bulging.  No middle ear effusion. No hemotympanum.  Mouth/Throat: Oropharynx is clear and moist.  Neck: Neck supple. No thyromegaly present.  Pulmonary/Chest: Effort normal.  Neurological: She is alert and oriented to person, place, and time.  Skin: Skin is warm and dry. No rash noted.  Psychiatric: She has a normal mood and affect.     Assessment & Plan:   Problem List Items Addressed This Visit    Otitis externa of left ear   Relevant Medications   ciprofloxacin-dexamethasone (CIPRODEX) otic suspension    Other Visit Diagnoses    Healthcare maintenance    -  Primary    Relevant Orders    Flu Vaccine QUAD 36+ mos IM (Completed)       No orders of the defined types were placed in this encounter.    Follow-up: No Follow-up on file.   Vlad Mayberry  [place] Giammona MD

## 2015-02-23 ENCOUNTER — Telehealth: Payer: Self-pay

## 2015-02-23 NOTE — Telephone Encounter (Signed)
Nurse called patient via [employer], Interpreter [phone]. Interpreter reached voicemail, left message for [facility] at [phone]. Message was left on voicemail. Nurse could not understand message. Nurse called to find out patients needs.

## 2015-03-10 NOTE — Telephone Encounter (Signed)
Nurse called patient via [employer], Interpreter [phone]. Interpreter left message for patient to return call to [facility] with [facility] at [phone].

## 2015-08-30 IMAGING — CR DG CHEST 1V
1 series · 1 of 1 positions shown · non-contrast
Comparison: None.

CLINICAL DATA: Positive TB test, asymptomatic patient

EXAM:
CHEST  1 VIEW

[w chest pa]
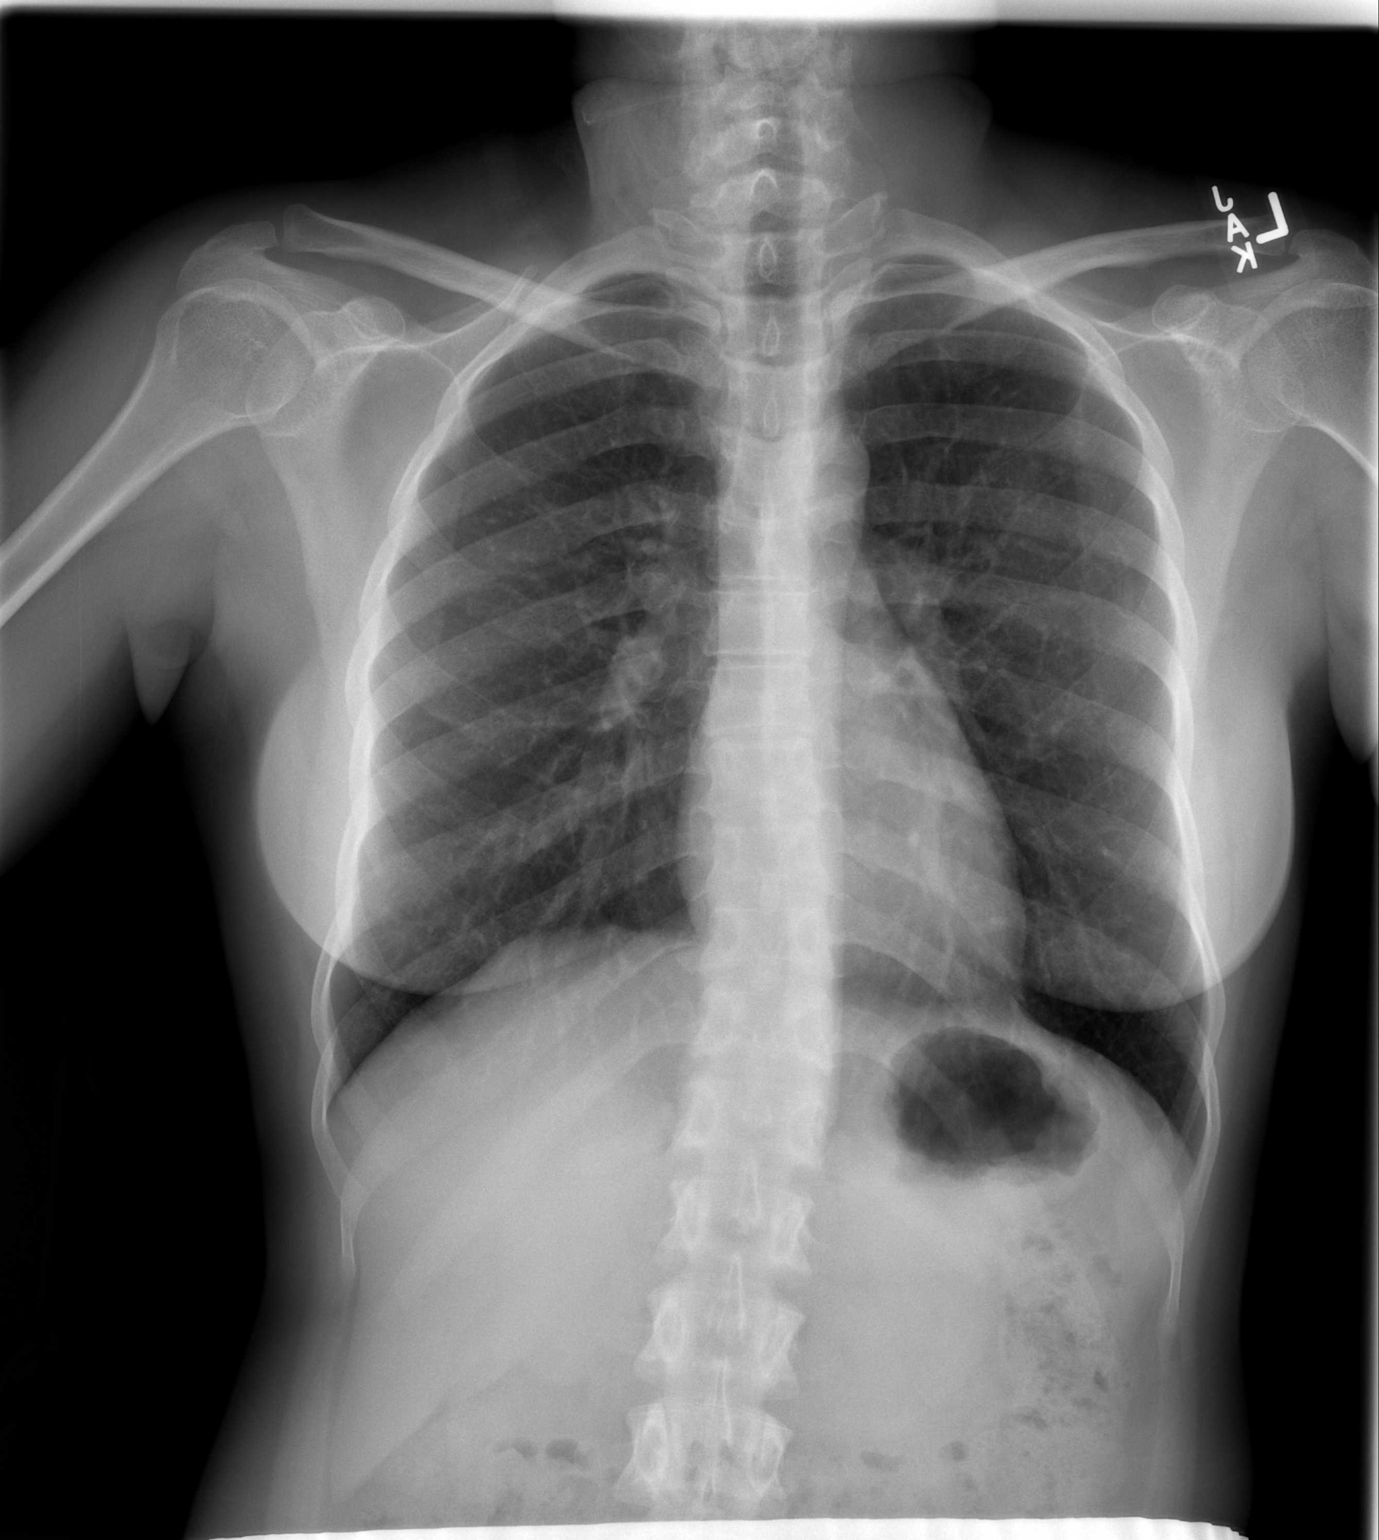

[1 of 1 positions shown; findings below may reference images not displayed]

FINDINGS: The lungs are adequately inflated. There is no focal infiltrate.
There is no pleural effusion. No soft tissue or calcified
parenchymal nodules are demonstrated. The heart and mediastinal
structures are normal. The bony thorax is unremarkable.
IMPRESSION: There are no findings to suggest acute or old tuberculous infection.

## 2019-05-07 ENCOUNTER — Ambulatory Visit (HOSPITAL_COMMUNITY)
Admission: EM | Admit: 2019-05-07 | Discharge: 2019-05-07 | Disposition: A | Discharge status: Home / Self Care | Payer: HRSA Program | Attending: Family Medicine | Admitting: Family Medicine

## 2019-05-07 ENCOUNTER — Other Ambulatory Visit: Payer: Self-pay

## 2019-05-07 ENCOUNTER — Encounter (HOSPITAL_COMMUNITY): Payer: Self-pay | Admitting: Emergency Medicine

## 2019-05-07 DIAGNOSIS — Z20822 Contact with and (suspected) exposure to covid-19: Secondary | ICD-10-CM | POA: Insufficient documentation

## 2019-05-07 DIAGNOSIS — J029 Acute pharyngitis, unspecified: Secondary | ICD-10-CM | POA: Diagnosis not present

## 2019-05-07 NOTE — Discharge Instructions (Signed)
Go home to rest Drink plenty of fluids Take Tylenol for pain or fever You must quarantine at home until your test result is available You can check for your test result in MyChart WHEN TEST IS AVAILABLE YOU NEED TO COME BACK FOR A COPY OF THE COVID TEST

## 2019-05-07 NOTE — ED Provider Notes (Signed)
[place]    CSN: [id number] Arrival date & time: 05/07/19  1725      History   Chief Complaint Chief Complaint  Patient presents with  . Headache  . Letter for School/Work    HPI Rachel Lucero is a 29 y.o. female.   HPI patient states she has sore throat, headache, and fever for 3 days.  She states that her symptoms are better now.  Her employer is aware that she was sick and is requiring her to have a Covid test before she comes back to work.  She works at a [employer].  She does not have any known exposure to Covid.  No body aches, headache, loss of taste or smell. She is seen with a tigrinian interpreter, originally from [place]  History reviewed. No pertinent past medical history.  Patient Active Problem List   Diagnosis Date Noted  . Otitis externa of left ear 01/02/2015  . 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 Vitamin D deficiency 05/24/2014  . Dizziness 05/23/2014  . 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 Allergic rhinitis 05/23/2014  . Menorrhagia with regular cycle 05/23/2014  . External hemorrhoid 05/23/2014    History reviewed. No pertinent surgical history.  OB History   No obstetric history on file.      Home Medications    Prior to Admission medications   Not on File    Family History Family History  Family history unknown: Yes    Social History Social History   Tobacco Use  . Smoking status: Never Smoker  Substance Use Topics  . Alcohol use: No    Alcohol/week: 0.0 standard drinks  . Drug use: No     Allergies   Patient has no known allergies.   Review of Systems Review of Systems  Constitutional:       Patient states asymptomatic now     Physical Exam Triage Vital Signs ED Triage Vitals [05/07/19 1736]  Enc Vitals Group     BP 129/64     Pulse Rate 100     Resp 18     Temp 98.7 F (37.1 C)     Temp src      SpO2 100 %   Weight      Height      Head Circumference      Peak Flow      Pain Score      Pain Loc      Pain Edu?      Excl. in [place]?    No data found.  Updated Vital Signs BP 129/64   Pulse 100   Temp 98.7 F (37.1 C)   Resp 18   LMP 04/30/2019   SpO2 100%      Physical Exam Constitutional:      General: She is not in acute distress.    Appearance: She is well-developed and normal weight. She is not ill-appearing.  HENT:     Head: Normocephalic and atraumatic.     Mouth/Throat:     Mouth: Mucous membranes are moist.     Pharynx: No posterior oropharyngeal erythema.     Comments: Throat benign.  Mask in place Eyes:     Conjunctiva/sclera: Conjunctivae normal.     Pupils: Pupils are equal, round, and reactive to light.  Cardiovascular:     Rate and Rhythm: Normal rate and regular rhythm.  Pulmonary:     Effort: Pulmonary effort is  normal. No respiratory distress.     Breath sounds: Normal breath sounds.  Musculoskeletal:        General: Normal range of motion.     Cervical back: Normal range of motion.  Skin:    General: Skin is warm and dry.  Neurological:     Mental Status: She is alert.  Psychiatric:        Mood and Affect: Mood normal.        Behavior: Behavior normal.      UC Treatments / Results  Labs (all labs ordered are listed, but only abnormal results are displayed) Labs Reviewed  NOVEL CORONAVIRUS, NAA (HOSP ORDER, SEND-OUT TO REF LAB; TAT 18-24 HRS)    EKG   Radiology No results found.  Procedures Procedures (including critical care time)  Medications Ordered in UC Medications - No data to display  Initial Impression / Assessment and Plan / UC Course  I have reviewed the triage vital signs and the nursing notes.  Pertinent labs & imaging results that were available during my care of the patient were reviewed by me and considered in my medical decision making (see chart for details).     Covid testing done.  Patient is instructed on how to  get her test results. Final Clinical Impressions(s) / UC Diagnoses   Final diagnoses:  Acute pharyngitis, unspecified etiology  Suspected COVID-19 virus infection     Discharge Instructions     Go home to rest Drink plenty of fluids Take Tylenol for pain or fever You must quarantine at home until your test result is available You can check for your test result in MyChart WHEN TEST IS AVAILABLE YOU NEED TO COME BACK FOR A COPY OF THE COVID TEST    ED Prescriptions    None     PDMP not reviewed this encounter.   Eustace Moore, MD 05/07/19 2103

## 2019-05-07 NOTE — ED Triage Notes (Signed)
Pt states she works at the [employer] and had a headache, sore throat, fever x3 days, states symptoms are better now, but needs a covid test to return to work.

## 2019-05-10 LAB — NOVEL CORONAVIRUS, NAA (HOSP ORDER, SEND-OUT TO REF LAB; TAT 18-24 HRS): SARS-CoV-2, NAA: NOT DETECTED

## 2019-06-24 ENCOUNTER — Other Ambulatory Visit: Payer: Self-pay

## 2019-06-24 ENCOUNTER — Ambulatory Visit (HOSPITAL_COMMUNITY)
Admission: EM | Admit: 2019-06-24 | Discharge: 2019-06-24 | Disposition: A | Discharge status: Home / Self Care | Payer: BC Managed Care – PPO | Attending: Family Medicine | Admitting: Family Medicine

## 2019-06-24 ENCOUNTER — Encounter (HOSPITAL_COMMUNITY): Payer: Self-pay

## 2019-06-24 DIAGNOSIS — J029 Acute pharyngitis, unspecified: Secondary | ICD-10-CM | POA: Insufficient documentation

## 2019-06-24 DIAGNOSIS — Z3202 Encounter for pregnancy test, result negative: Secondary | ICD-10-CM | POA: Diagnosis not present

## 2019-06-24 DIAGNOSIS — Z20822 Contact with and (suspected) exposure to covid-19: Secondary | ICD-10-CM | POA: Insufficient documentation

## 2019-06-24 DIAGNOSIS — R350 Frequency of micturition: Secondary | ICD-10-CM | POA: Diagnosis not present

## 2019-06-24 DIAGNOSIS — R1030 Lower abdominal pain, unspecified: Secondary | ICD-10-CM | POA: Diagnosis not present

## 2019-06-24 DIAGNOSIS — R6883 Chills (without fever): Secondary | ICD-10-CM | POA: Diagnosis not present

## 2019-06-24 DIAGNOSIS — G4489 Other headache syndrome: Secondary | ICD-10-CM | POA: Diagnosis not present

## 2019-06-24 LAB — POCT URINALYSIS DIP (DEVICE)
Glucose, UA: NEGATIVE mg/dL
Hgb urine dipstick: NEGATIVE
Ketones, ur: NEGATIVE mg/dL
Nitrite: NEGATIVE
Protein, ur: NEGATIVE mg/dL
Specific Gravity, Urine: >=1.03 (ref 1.005–1.030)
Urobilinogen, UA: 0.2 mg/dL (ref 0.0–1.0)
pH: 5.5 (ref 5.0–8.0)

## 2019-06-24 LAB — POC URINE PREG, ED: Preg Test, Ur: NEGATIVE

## 2019-06-24 LAB — SARS CORONAVIRUS 2 (TAT 6-24 HRS): SARS Coronavirus 2: NEGATIVE

## 2019-06-24 LAB — POCT PREGNANCY, URINE: Preg Test, Ur: NEGATIVE

## 2019-06-24 MED ORDER — IBUPROFEN 600 MG PO TABS: 600.0000 mg | ORAL_TABLET | Freq: Four times a day (QID) | ORAL | 0 refills | Status: DC | PRN

## 2019-06-24 NOTE — Discharge Instructions (Signed)
Your COVID test is pending.  You should self quarantine until the test result is back.    Take Tylenol as needed for fever or discomfort.  Rest and keep yourself hydrated.    Go to the emergency department if you develop shortness of breath, severe diarrhea, high fever not relieved by Tylenol or ibuprofen, or other concerning symptoms.    

## 2019-06-24 NOTE — ED Provider Notes (Signed)
[facility]    CSN: [id number] Arrival date & time: 06/24/19  9357      History   Chief Complaint Chief Complaint  Patient presents with  . Sore Throat    HPI Rachel Lucero is a 29 y.o. female.   Exam and HPI communicated with [employer] [place] audio interpreter.  Patient reports that she has been having chills, headache, sore throat, low abdominal pain, urinary frequency that all of her symptoms began yesterday.  Has made no attempts to treat these at home.  Denies dysuria, vaginal discharge, vaginal itching, burning.  Denies any concern for STDs or pregnancy today.  Reports that she uses condoms during sex.  Denies sick contacts.  Denies body aches, shortness of breath, nausea, vomiting, diarrhea, fever, rash, other symptoms.  Patient reviewed, patient has history significant for latent TB, allergic rhinitis,Taenia saginata infection.  ROS per HPI  The history is provided by the patient.    History reviewed. No pertinent past medical history.  Patient Active Problem List   Diagnosis Date Noted  . Otitis externa of left ear 01/02/2015  . 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 Vitamin D deficiency 05/24/2014  . Dizziness 05/23/2014  . 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 Allergic rhinitis 05/23/2014  . Menorrhagia with regular cycle 05/23/2014  . External hemorrhoid 05/23/2014    History reviewed. No pertinent surgical history.  OB History   No obstetric history on file.      Home Medications    Prior to Admission medications   Medication Sig Start Date End Date Taking? Authorizing Provider  ibuprofen (ADVIL) 600 MG tablet Take 1 tablet (600 mg total) by mouth every 6 (six) hours as needed. 06/24/19   Moshe Cipro, NP    Family History Family History  Family history unknown: Yes    Social History Social History   Tobacco Use  . Smoking status: Never Smoker  . Smokeless  tobacco: Never Used  Substance Use Topics  . Alcohol use: No    Alcohol/week: 0.0 standard drinks  . Drug use: No     Allergies   Patient has no known allergies.   Review of Systems Review of Systems   Physical Exam Triage Vital Signs ED Triage Vitals  Enc Vitals Group     BP 06/24/19 1013 113/77     Pulse Rate 06/24/19 1013 89     Resp 06/24/19 1013 18     Temp 06/24/19 1013 98.2 F (36.8 C)     Temp Source 06/24/19 1013 Oral     SpO2 06/24/19 1013 100 %     Weight --      Height --      Head Circumference --      Peak Flow --      Pain Score 06/24/19 1008 9     Pain Loc --      Pain Edu? --      Excl. in [place]? --    No data found.  Updated Vital Signs BP 113/77 (BP Location: Right Arm)   Pulse 89   Temp 98.2 F (36.8 C) (Oral)   Resp 18   LMP 06/17/2019 (Approximate)   SpO2 100%   Visual Acuity Right Eye Distance:   Left Eye Distance:   Bilateral Distance:    Right Eye Near:   Left Eye Near:    Bilateral Near:     Physical Exam Vitals and nursing note reviewed.  Constitutional:  General: She is not in acute distress.    Appearance: Normal appearance. She is well-developed and normal weight. She is ill-appearing.  HENT:     Head: Normocephalic and atraumatic.     Right Ear: Tympanic membrane normal.     Left Ear: Tympanic membrane normal.     Nose: Nose normal.     Mouth/Throat:     Mouth: Mucous membranes are dry.     Pharynx: Oropharynx is clear.  Eyes:     Extraocular Movements: Extraocular movements intact.     Conjunctiva/sclera: Conjunctivae normal.     Pupils: Pupils are equal, round, and reactive to light.  Cardiovascular:     Rate and Rhythm: Normal rate and regular rhythm.     Heart sounds: Normal heart sounds. No murmur.  Pulmonary:     Effort: Pulmonary effort is normal. No respiratory distress.     Breath sounds: Normal breath sounds. No stridor. No wheezing, rhonchi or rales.  Chest:     Chest wall: No tenderness.    Abdominal:     General: Bowel sounds are normal. There is no distension.     Palpations: Abdomen is soft. There is no mass.     Tenderness: There is abdominal tenderness (Suprapubic). There is no right CVA tenderness, left CVA tenderness, guarding or rebound.     Hernia: No hernia is present.  Musculoskeletal:        General: Normal range of motion.     Cervical back: Normal range of motion and neck supple.  Lymphadenopathy:     Cervical: Cervical adenopathy (Bilateral) present.  Skin:    General: Skin is warm and dry.     Capillary Refill: Capillary refill takes less than 2 seconds.  Neurological:     General: No focal deficit present.     Mental Status: She is alert and oriented to person, place, and time.  Psychiatric:        Mood and Affect: Mood normal.        Behavior: Behavior normal.        Thought Content: Thought content normal.      UC Treatments / Results  Labs (all labs ordered are listed, but only abnormal results are displayed) Labs Reviewed  URINE CULTURE - Abnormal; Notable for the following components:      Result Value   Culture   (*)    Value: >=100,000 COLONIES/mL DIPHTHEROIDS(CORYNEBACTERIUM SPECIES) Standardized susceptibility testing for this organism is not available. Performed at [facility], [address] [place], [place] [address]    All other components within normal limits  POCT URINALYSIS DIP (DEVICE) - Abnormal; Notable for the following components:   Bilirubin Urine SMALL (*)    Leukocytes,Ua SMALL (*)    All other components within normal limits  SARS CORONAVIRUS 2 (TAT 6-24 HRS)  POCT PREGNANCY, URINE  POC URINE PREG, ED    EKG   Radiology No results found.  Procedures Procedures (including critical care time)  Medications Ordered in UC Medications - No data to display  Initial Impression / Assessment and Plan / UC Course  I have reviewed the triage vital signs and the nursing notes.  Pertinent labs & imaging  results that were available during my care of the patient were reviewed by me and considered in my medical decision making (see chart for details).     Lower abdominal pain: Presents with lower abdominal pain, sore throat, headache, chills, urinary frequency x1 day.  UA in office  is not convincing for infection, showing small bili and small leuks.  Will culture urine for further growth.  Inform patient that her results will be available via MyChart.  Inform patient that we will let her know if she needs any further treatment if her urine culture grows out any bacteria.  Urine pregnancy test negative in office today.  Covid swab also in office today.  Patient instructed that her results will be available via MyChart.  Instructed to quarantine until results are back and negative.  Patient instructed that if her results are negative, she may resume her schedule as usual.  If her results are positive, patient is to quarantine for 10 days from today.  Discussed with patient that if she begins to have trouble swallowing, trouble breathing, high fever, or other concerning symptoms she needs to go to the ER for further evaluation.  Patient verbalizes understanding via interpreter, and agrees to treatment plan. Final Clinical Impressions(s) / UC Diagnoses   Final diagnoses:  Pharyngitis, unspecified etiology  Other headache syndrome  Lower abdominal pain  Chills  Increased urinary frequency     Discharge Instructions     Your COVID test is pending.  You should self quarantine until the test result is back.    Take Tylenol as needed for fever or discomfort.  Rest and keep yourself hydrated.    Go to the emergency department if you develop shortness of breath, severe diarrhea, high fever not relieved by Tylenol or ibuprofen, or other concerning symptoms.       ED Prescriptions    Medication Sig Dispense Auth. Provider   ibuprofen (ADVIL) 600 MG tablet Take 1 tablet (600 mg total) by mouth every 6  (six) hours as needed. 30 tablet Faustino Congress, NP     I have reviewed the PDMP during this encounter.   Faustino Congress, NP 06/25/19 [id number]

## 2019-06-24 NOTE — ED Triage Notes (Signed)
Pt c/o chills, HA, sore throat, abdom pain, urinary frequency onset yesterday. Denies burning with urination, n/v/d.

## 2019-06-25 LAB — URINE CULTURE: Culture: >=100000 — AB

## 2019-06-30 ENCOUNTER — Ambulatory Visit (HOSPITAL_COMMUNITY)
Admission: EM | Admit: 2019-06-30 | Discharge: 2019-06-30 | Disposition: A | Discharge status: Home / Self Care | Payer: BC Managed Care – PPO | Attending: Family Medicine | Admitting: Family Medicine

## 2019-06-30 ENCOUNTER — Other Ambulatory Visit: Payer: Self-pay

## 2019-06-30 DIAGNOSIS — Z20822 Contact with and (suspected) exposure to covid-19: Secondary | ICD-10-CM | POA: Insufficient documentation

## 2019-06-30 NOTE — ED Provider Notes (Signed)
[place]    CSN: [id number] Arrival date & time: 06/30/19  1643      History   Chief Complaint Chief Complaint  Patient presents with  . Covid Exposure    HPI Reed Derner is a 29 y.o. female.   HPI  Patient presents for a COVID-19 test for work. She tested negative for COVID-19 on 06/24/19. Her employer is requiring a second negative COVID-19 test prior to patient being permitted to return back to work. Asymptomatic of any COVID-19 related symptoms today.   No past medical history on file.  Patient Active Problem List   Diagnosis Date Noted  . Otitis externa of left ear 01/02/2015  . 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 Vitamin D deficiency 05/24/2014  . Dizziness 05/23/2014  . 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 Allergic rhinitis 05/23/2014  . Menorrhagia with regular cycle 05/23/2014  . External hemorrhoid 05/23/2014    No past surgical history on file.  OB History   No obstetric history on file.      Home Medications    Prior to Admission medications   Medication Sig Start Date End Date Taking? Authorizing Provider  ibuprofen (ADVIL) 600 MG tablet Take 1 tablet (600 mg total) by mouth every 6 (six) hours as needed. 06/24/19   Faustino Congress, NP    Family History Family History  Family history unknown: Yes    Social History Social History   Tobacco Use  . Smoking status: Never Smoker  . Smokeless tobacco: Never Used  Substance Use Topics  . Alcohol use: No    Alcohol/week: 0.0 standard drinks  . Drug use: No     Allergies   Patient has no known allergies.   Review of Systems Review of Systems Pertinent negatives listed in HPI Physical Exam Triage Vital Signs ED Triage Vitals  Enc Vitals Group     BP      Pulse      Resp      Temp      Temp src      SpO2      Weight      Height      Head Circumference      Peak Flow      Pain Score      Pain  Loc      Pain Edu?      Excl. in [place]?    No data found.  Updated Vital Signs BP 120/75   Pulse 82   Temp 97.8 F (36.6 C)   Resp 16   LMP 06/17/2019 (Approximate)   SpO2 100%   Visual Acuity Right Eye Distance:   Left Eye Distance:   Bilateral Distance:    Right Eye Near:   Left Eye Near:    Bilateral Near:     Physical Exam General appearance: alert, well developed, well nourished, cooperative and in no distress Head: Normocephalic, without obvious abnormality, atraumatic Respiratory: Respirations even and unlabored, normal respiratory rate Heart: rate and rhythm normal.  Extremities: No gross deformities Skin: Skin color, texture, turgor normal. No rashes seen  Psych: Appropriate mood and affect. Neurologic: Mental status: Alert, oriented to person, place, and time, thought content appropriate.  UC Treatments / Results  Labs (all labs ordered are listed, but only abnormal results are displayed) Labs Reviewed  SARS CORONAVIRUS 2 (TAT 6-24 HRS)    EKG   Radiology No results found.  Procedures Procedures (including critical  care time)  Medications Ordered in UC Medications - No data to display  Initial Impression / Assessment and Plan / UC Course  I have reviewed the triage vital signs and the nursing notes.  Pertinent labs & imaging results that were available during my care of the patient were reviewed by me and considered in my medical decision making (see chart for details).     COVID-19 test pending. Work note provided for 07/02/19. To allow time for test to result. COVID test completed on 06/24/19 negative. Patient remains asymptomatic. Final Clinical Impressions(s) / UC Diagnoses   Final diagnoses:  Encounter for laboratory testing for COVID-19 virus     Discharge Instructions     Your COVID 19 results will be available in 24 hours. Negative results are immediately resulted to Mychart.     ED Prescriptions    None     PDMP not reviewed  this encounter.   Bing Neighbors, FNP 06/30/19 [phone]

## 2019-06-30 NOTE — ED Notes (Signed)
I had to access patient's chart to obtain the orders for covid testing

## 2019-06-30 NOTE — Discharge Instructions (Signed)
Your COVID 19 results will be available in 24 hours. Negative results are immediately resulted to Mychart.

## 2019-06-30 NOTE — ED Triage Notes (Signed)
Pt requesting covid testing, states her work requires a second negative test before returning. Denies symptoms.

## 2019-07-01 LAB — SARS CORONAVIRUS 2 (TAT 6-24 HRS): SARS Coronavirus 2: NEGATIVE

## 2019-07-02 ENCOUNTER — Telehealth (HOSPITAL_COMMUNITY): Payer: Self-pay

## 2019-07-02 MED ORDER — CIPROFLOXACIN HCL 500 MG PO TABS: 500.0000 mg | ORAL_TABLET | Freq: Two times a day (BID) | ORAL | 0 refills | Status: AC

## 2019-07-02 NOTE — Telephone Encounter (Signed)
Per Judeth Cornfield if the patient is still symptomatic Cipro 500 mg BID x 7 days.

## 2019-07-07 ENCOUNTER — Ambulatory Visit: Payer: Self-pay | Admitting: Nurse Practitioner

## 2019-07-14 ENCOUNTER — Ambulatory Visit: Payer: BC Managed Care – PPO | Attending: Nurse Practitioner | Admitting: Nurse Practitioner

## 2019-07-14 ENCOUNTER — Encounter: Payer: Self-pay | Admitting: Nurse Practitioner

## 2019-07-14 ENCOUNTER — Other Ambulatory Visit: Payer: Self-pay

## 2019-07-14 DIAGNOSIS — Z130 Encounter for screening for diseases of the blood and blood-forming organs and certain disorders involving the immune mechanism: Secondary | ICD-10-CM

## 2019-07-14 DIAGNOSIS — Z13228 Encounter for screening for other metabolic disorders: Secondary | ICD-10-CM | POA: Diagnosis not present

## 2019-07-14 DIAGNOSIS — Z13220 Encounter for screening for lipoid disorders: Secondary | ICD-10-CM

## 2019-07-14 DIAGNOSIS — Z131 Encounter for screening for diabetes mellitus: Secondary | ICD-10-CM

## 2019-07-14 DIAGNOSIS — Z7689 Persons encountering health services in other specified circumstances: Secondary | ICD-10-CM | POA: Diagnosis not present

## 2019-07-14 NOTE — Progress Notes (Signed)
Virtual Visit via Telephone Note Due to national recommendations of social distancing due to [place] 19, telehealth visit is felt to be most appropriate for this patient at this time.  I discussed the limitations, risks, security and privacy concerns of performing an evaluation and management service by telephone and the availability of in person appointments. I also discussed with the patient that there may be a patient responsible charge related to this service. The patient expressed understanding and agreed to proceed.    I connected with Rachel Lucero on 07/14/19  at  10:10 AM EDT  EDT by telephone and verified that I am speaking with the correct person using two identifiers.   Consent I discussed the limitations, risks, security and privacy concerns of performing an evaluation and management service by telephone and the availability of in person appointments. I also discussed with the patient that there may be a patient responsible charge related to this service. The patient expressed understanding and agreed to proceed.   Location of Patient: Private Residence    Location of Provider: [place] and [employer] Office    Persons participating in Telemedicine visit: Rachel Rankins FNP-BC [place]  Interpreter ID# [id number]   History of Present Illness: Telemedicine visit for: Establish Care  has a past medical history of Taenia saginata infection (2016).  .  Treated in the past for tapeworm infection. Overdue for PAP. Will schedule for PAP smear. She has no questions or concerns today.    Past Medical History:  Diagnosis Date  . Taenia saginata infection 2016    Past Surgical History:  Procedure Laterality Date  . NO PAST SURGERIES      Family History  Problem Relation Age of Onset  . Diabetes Neg Hx   . Hyperlipidemia Neg Hx     Social History   Socioeconomic History  . Marital status: Unknown    Spouse name: Not on file   . Number of children: Not on file  . Years of education: Not on file  . Highest education level: Not on file  Occupational History  . Not on file  Tobacco Use  . Smoking status: Never Smoker  . Smokeless tobacco: Never Used  Substance and Sexual Activity  . Alcohol use: No    Alcohol/week: 0.0 standard drinks  . Drug use: No  . Sexual activity: Not on file  Other Topics Concern  . Not on file  Social History Narrative  . Not on file   Social Determinants of Health   Financial Resource Strain:   . Difficulty of Paying Living Expenses:   Food Insecurity:   . Worried About Charity fundraiser in the Last Year:   . Arboriculturist in the Last Year:   Transportation Needs:   . Film/video editor (Medical):   Marland Kitchen Lack of Transportation (Non-Medical):   Physical Activity:   . Days of Exercise per Week:   . Minutes of Exercise per Session:   Stress:   . Feeling of Stress :   Social Connections:   . Frequency of Communication with Friends and Family:   . Frequency of Social Gatherings with Friends and Family:   . Attends Religious Services:   . Active Member of Clubs or Organizations:   . Attends Archivist Meetings:   Marland Kitchen Marital Status:      Observations/Objective: Awake, alert and oriented x 3   Review of Systems  Constitutional: Negative for fever, malaise/fatigue and weight  loss.  HENT: Negative.  Negative for nosebleeds.   Eyes: Negative.  Negative for blurred vision, double vision and photophobia.  Respiratory: Negative.  Negative for cough and shortness of breath.   Cardiovascular: Negative.  Negative for chest pain, palpitations and leg swelling.  Gastrointestinal: Negative.  Negative for heartburn, nausea and vomiting.  Musculoskeletal: Negative.  Negative for myalgias.  Neurological: Negative.  Negative for dizziness, focal weakness, seizures and headaches.  Psychiatric/Behavioral: Negative.  Negative for suicidal ideas.    Assessment and  Plan: [name] was seen today for new patient (initial visit).  Diagnoses and all orders for this visit:  Encounter to establish care  Lipid screening -     Lipid panel; Future  Screening for deficiency anemia -     CBC; Future  Screening for metabolic disorder -     [id number]; Future  Encounter for screening for diabetes mellitus -     Hemoglobin A1c; Future     Follow Up Instructions Return for PAP SMEAR.     I discussed the assessment and treatment plan with the patient. The patient was provided an opportunity to ask questions and all were answered. The patient agreed with the plan and demonstrated an understanding of the instructions.   The patient was advised to call back or seek an in-person evaluation if the symptoms worsen or if the condition fails to improve as anticipated.  I provided 10 minutes of non-face-to-face time during this encounter including median intraservice time, reviewing previous notes, labs, imaging, medications and explaining diagnosis and management.  Gildardo Pounds, FNP-BC

## 2019-07-20 ENCOUNTER — Other Ambulatory Visit: Payer: BC Managed Care – PPO

## 2019-08-10 ENCOUNTER — Encounter: Payer: Self-pay | Admitting: Nurse Practitioner

## 2019-08-10 ENCOUNTER — Other Ambulatory Visit: Payer: Self-pay

## 2019-08-10 ENCOUNTER — Ambulatory Visit: Payer: BC Managed Care – PPO | Attending: Nurse Practitioner | Admitting: Nurse Practitioner

## 2019-08-10 NOTE — Progress Notes (Signed)
Pt. Is still on her menstrual cycle. Pt. Is rescheduled in two week to come back for her pap smear.

## 2019-08-27 ENCOUNTER — Encounter: Payer: Self-pay | Admitting: Nurse Practitioner

## 2019-08-27 ENCOUNTER — Other Ambulatory Visit (HOSPITAL_COMMUNITY)
Admission: RE | Admit: 2019-08-27 | Discharge: 2019-08-27 | Disposition: A | Discharge status: Home / Self Care | Payer: BC Managed Care – PPO | Source: Ambulatory Visit | Attending: Nurse Practitioner | Admitting: Nurse Practitioner

## 2019-08-27 ENCOUNTER — Ambulatory Visit (HOSPITAL_BASED_OUTPATIENT_CLINIC_OR_DEPARTMENT_OTHER): Payer: BC Managed Care – PPO | Admitting: Nurse Practitioner

## 2019-08-27 ENCOUNTER — Other Ambulatory Visit: Payer: Self-pay

## 2019-08-27 VITALS — BP 118/80 | HR 90 | Temp 97.9°F | Resp 16 | Wt 128.2 lb

## 2019-08-27 DIAGNOSIS — Z124 Encounter for screening for malignant neoplasm of cervix: Secondary | ICD-10-CM | POA: Diagnosis not present

## 2019-08-27 DIAGNOSIS — Z13228 Encounter for screening for other metabolic disorders: Secondary | ICD-10-CM | POA: Diagnosis not present

## 2019-08-27 DIAGNOSIS — Z13220 Encounter for screening for lipoid disorders: Secondary | ICD-10-CM | POA: Diagnosis not present

## 2019-08-27 DIAGNOSIS — R109 Unspecified abdominal pain: Secondary | ICD-10-CM | POA: Diagnosis not present

## 2019-08-27 DIAGNOSIS — Z131 Encounter for screening for diabetes mellitus: Secondary | ICD-10-CM

## 2019-08-27 DIAGNOSIS — Z130 Encounter for screening for diseases of the blood and blood-forming organs and certain disorders involving the immune mechanism: Secondary | ICD-10-CM

## 2019-08-27 LAB — POCT URINALYSIS DIP (CLINITEK)
Bilirubin, UA: NEGATIVE
Glucose, UA: NEGATIVE mg/dL
Ketones, POC UA: NEGATIVE mg/dL
Leukocytes, UA: NEGATIVE
Nitrite, UA: NEGATIVE
POC PROTEIN,UA: NEGATIVE
Spec Grav, UA: >=1.03 — AB (ref 1.010–1.025)
Urobilinogen, UA: 0.2 E.U./dL
pH, UA: 6 (ref 5.0–8.0)

## 2019-08-27 NOTE — Progress Notes (Signed)
Assessment & Plan:  Rachel Lucero was seen today for gynecologic exam.  Diagnoses and all orders for this visit:  Encounter for Papanicolaou smear for cervical cancer screening -     Cytology - PAP -     Cervicovaginal ancillary only  Abdominal pressure -     POCT URINALYSIS DIP (CLINITEK)  Lipid screening -     Lipid panel  Screening for metabolic disorder -     [id number]  Screening for deficiency anemia -     CBC  Encounter for screening for diabetes mellitus -     Hemoglobin A1c    Patient has been counseled on age-appropriate routine health concerns for screening and prevention. These are reviewed and up-to-date. Referrals have been placed accordingly. Immunizations are up-to-date or declined.    Subjective:   Chief Complaint  Patient presents with  . Gynecologic Exam   HPI Rachel Lucero 29 y.o. female presents to office today for PAP smear. She has complaints of bilateral swelling of feet during the day while she is at work. Also shortness of breath when climbing stairs at work. There is no visible BLE edema/swellling present on exam today. I recommended compression socks while she is working. Cardiopulmonary exam unremarkable today as well.    Review of Systems  Constitutional: Negative.  Negative for chills, fever, malaise/fatigue and weight loss.  Respiratory: Positive for shortness of breath. Negative for cough, hemoptysis, sputum production and wheezing.   Cardiovascular: Positive for leg swelling. Negative for chest pain and orthopnea.  Gastrointestinal: Negative for abdominal pain.  Genitourinary: Negative.  Negative for flank pain.  Skin: Negative.  Negative for rash.  Psychiatric/Behavioral: Negative for suicidal ideas.    Past Medical History:  Diagnosis Date  . Taenia saginata infection 2016    Past Surgical History:  Procedure Laterality Date  . NO PAST SURGERIES      Family History  Problem Relation Age of Onset  . Diabetes Neg Hx   .  Hyperlipidemia Neg Hx     Social History Reviewed with no changes to be made today.   Outpatient Medications Prior to Visit  Medication Sig Dispense Refill  . ibuprofen (ADVIL) 600 MG tablet Take 1 tablet (600 mg total) by mouth every 6 (six) hours as needed. (Patient not taking: Reported on 07/14/2019) 30 tablet 0   No facility-administered medications prior to visit.    No Known Allergies     Objective:    BP 118/80   Pulse 90   Temp 97.9 F (36.6 C)   Resp 16   Wt 128 lb 3.2 oz (58.2 kg)   SpO2 98%   BMI 23.45 kg/m  Wt Readings from Last 3 Encounters:  08/27/19 128 lb 3.2 oz (58.2 kg)  08/10/19 127 lb (57.6 kg)  01/02/15 112 lb (50.8 kg)    Physical Exam Exam conducted with a chaperone present.  Constitutional:      Appearance: She is well-developed.  HENT:     Head: Normocephalic.  Cardiovascular:     Rate and Rhythm: Normal rate and regular rhythm.     Pulses:          Dorsalis pedis pulses are 1+ on the right side and 1+ on the left side.       Posterior tibial pulses are 1+ on the right side and 1+ on the left side.     Heart sounds: Normal heart sounds.  Pulmonary:     Effort: Pulmonary effort is normal.  Breath sounds: Normal breath sounds and air entry. No decreased breath sounds, wheezing, rhonchi or rales.  Abdominal:     General: Bowel sounds are normal.     Palpations: Abdomen is soft.     Tenderness: There is no abdominal tenderness. There is no right CVA tenderness, left CVA tenderness, guarding or rebound. Negative signs include Murphy's sign, Rovsing's sign, McBurney's sign, psoas sign and obturator sign.     Hernia: No hernia is present. There is no hernia in the left inguinal area.  Genitourinary:    Exam position: Lithotomy position.     Labia:        Right: No rash, tenderness, lesion or injury.        Left: No rash, tenderness, lesion or injury.      Vagina: Normal. No signs of injury and foreign body. No vaginal discharge, erythema,  tenderness or bleeding.     Cervix: No cervical motion tenderness or friability.     Uterus: Not deviated and not enlarged.      Adnexa:        Right: No mass, tenderness or fullness.         Left: No mass, tenderness or fullness.       Rectum: Normal. No external hemorrhoid.  Musculoskeletal:     Right lower leg: No edema.     Left lower leg: No edema.  Lymphadenopathy:     Lower Body: No right inguinal adenopathy. No left inguinal adenopathy.  Skin:    General: Skin is warm and dry.  Neurological:     Mental Status: She is alert and oriented to person, place, and time.  Psychiatric:        Behavior: Behavior normal.        Thought Content: Thought content normal.        Judgment: Judgment normal.          Patient has been counseled extensively about nutrition and exercise as well as the importance of adherence with medications and regular follow-up. The patient was given clear instructions to go to ER or return to medical center if symptoms don't improve, worsen or new problems develop. The patient verbalized understanding.   Follow-up: Return if symptoms worsen or fail to improve.   Gildardo Pounds, FNP-BC [facility] and [place], [place]   08/27/2019, 12:02 PM

## 2019-08-28 LAB — CMP14+EGFR
ALT: 14 IU/L (ref 0–32)
AST: 15 IU/L (ref 0–40)
Albumin/Globulin Ratio: 1.5 (ref 1.2–2.2)
Albumin: 4.1 g/dL (ref 3.9–5.0)
Alkaline Phosphatase: 81 IU/L (ref 48–121)
BUN/Creatinine Ratio: 14 (ref 9–23)
BUN: 8 mg/dL (ref 6–20)
Bilirubin Total: 0.4 mg/dL (ref 0.0–1.2)
CO2: 21 mmol/L (ref 20–29)
Calcium: 9 mg/dL (ref 8.7–10.2)
Chloride: 106 mmol/L (ref 96–106)
Creatinine, Ser: 0.59 mg/dL (ref 0.57–1.00)
GFR calc Af Amer: 142 mL/min/{1.73_m2} (ref >59)
GFR calc non Af Amer: 123 mL/min/{1.73_m2} (ref >59)
Globulin, Total: 2.8 g/dL (ref 1.5–4.5)
Glucose: 87 mg/dL (ref 65–99)
Potassium: 4.5 mmol/L (ref 3.5–5.2)
Sodium: 140 mmol/L (ref 134–144)
Total Protein: 6.9 g/dL (ref 6.0–8.5)

## 2019-08-28 LAB — LIPID PANEL
Chol/HDL Ratio: 3.1 ratio (ref 0.0–4.4)
Cholesterol, Total: 183 mg/dL (ref 100–199)
HDL: 59 mg/dL (ref >39)
LDL Chol Calc (NIH): 105 mg/dL — ABNORMAL HIGH (ref 0–99)
Triglycerides: 104 mg/dL (ref 0–149)
VLDL Cholesterol Cal: 19 mg/dL (ref 5–40)

## 2019-08-28 LAB — CBC
Hematocrit: 39.7 % (ref 34.0–46.6)
Hemoglobin: 12.7 g/dL (ref 11.1–15.9)
MCH: 28.8 pg (ref 26.6–33.0)
MCHC: 32 g/dL (ref 31.5–35.7)
MCV: 90 fL (ref 79–97)
Platelets: 179 10*3/uL (ref 150–450)
RBC: 4.41 x10E6/uL (ref 3.77–5.28)
RDW: 13 % (ref 11.7–15.4)
WBC: 3.8 10*3/uL (ref 3.4–10.8)

## 2019-08-28 LAB — HEMOGLOBIN A1C
Est. average glucose Bld gHb Est-mCnc: 108 mg/dL
Hgb A1c MFr Bld: 5.4 % (ref 4.8–5.6)

## 2019-08-30 ENCOUNTER — Other Ambulatory Visit (INDEPENDENT_AMBULATORY_CARE_PROVIDER_SITE_OTHER): Payer: Self-pay | Admitting: Primary Care

## 2019-08-30 LAB — CERVICOVAGINAL ANCILLARY ONLY
Bacterial Vaginitis (gardnerella): POSITIVE — AB
Candida Glabrata: NEGATIVE
Candida Vaginitis: NEGATIVE
Chlamydia: NEGATIVE
Comment: NEGATIVE
Comment: NEGATIVE
Comment: NEGATIVE
Comment: NEGATIVE
Comment: NEGATIVE
Comment: NORMAL
Neisseria Gonorrhea: NEGATIVE
Trichomonas: NEGATIVE

## 2019-08-30 MED ORDER — METRONIDAZOLE 500 MG PO TABS: 500.0000 mg | ORAL_TABLET | Freq: Two times a day (BID) | ORAL | 0 refills | Status: DC

## 2019-08-31 MED FILL — metroNIDAZOLE 500 MG TABS: 500 | 7 days supply | Qty: 14 | Fill #0

## 2019-09-01 LAB — CYTOLOGY - PAP
Adequacy: ABNORMAL
Comment: NEGATIVE

## 2019-09-10 MED FILL — metroNIDAZOLE 500 MG TABS: 500 | 7 days supply | Qty: 14 | Fill #0

## 2019-09-29 ENCOUNTER — Ambulatory Visit: Payer: BC Managed Care – PPO | Admitting: Nurse Practitioner

## 2019-11-04 ENCOUNTER — Other Ambulatory Visit (HOSPITAL_COMMUNITY)
Admission: RE | Admit: 2019-11-04 | Discharge: 2019-11-04 | Disposition: A | Discharge status: Home / Self Care | Payer: BC Managed Care – PPO | Source: Ambulatory Visit | Attending: Nurse Practitioner | Admitting: Nurse Practitioner

## 2019-11-04 ENCOUNTER — Encounter: Payer: Self-pay | Admitting: Physician Assistant

## 2019-11-04 ENCOUNTER — Ambulatory Visit: Payer: BC Managed Care – PPO | Attending: Nurse Practitioner | Admitting: Physician Assistant

## 2019-11-04 ENCOUNTER — Other Ambulatory Visit: Payer: Self-pay

## 2019-11-04 VITALS — BP 118/80 | HR 80 | Temp 97.7°F | Wt 128.0 lb

## 2019-11-04 DIAGNOSIS — Z789 Other specified health status: Secondary | ICD-10-CM

## 2019-11-04 DIAGNOSIS — N760 Acute vaginitis: Secondary | ICD-10-CM | POA: Diagnosis not present

## 2019-11-04 DIAGNOSIS — N898 Other specified noninflammatory disorders of vagina: Secondary | ICD-10-CM | POA: Diagnosis present

## 2019-11-04 DIAGNOSIS — B9689 Other specified bacterial agents as the cause of diseases classified elsewhere: Secondary | ICD-10-CM | POA: Diagnosis not present

## 2019-11-04 DIAGNOSIS — B373 Candidiasis of vulva and vagina: Secondary | ICD-10-CM | POA: Diagnosis not present

## 2019-11-04 DIAGNOSIS — R87619 Unspecified abnormal cytological findings in specimens from cervix uteri: Secondary | ICD-10-CM

## 2019-11-04 DIAGNOSIS — R300 Dysuria: Secondary | ICD-10-CM

## 2019-11-04 LAB — POCT URINALYSIS DIP (CLINITEK)
Bilirubin, UA: NEGATIVE
Glucose, UA: NEGATIVE mg/dL
Ketones, POC UA: NEGATIVE mg/dL
Leukocytes, UA: NEGATIVE
Nitrite, UA: NEGATIVE
POC PROTEIN,UA: NEGATIVE
Spec Grav, UA: 1.025 (ref 1.010–1.025)
Urobilinogen, UA: 0.2 E.U./dL
pH, UA: 5.5 (ref 5.0–8.0)

## 2019-11-04 LAB — GLUCOSE, POCT (MANUAL RESULT ENTRY): POC Glucose: 103 mg/dl — AB (ref 70–99)

## 2019-11-04 MED ORDER — FLUCONAZOLE 150 MG PO TABS: 150.0000 mg | ORAL_TABLET | Freq: Once | ORAL | 0 refills | Status: AC

## 2019-11-04 MED FILL — FLUCONAZOLE 150 MG TABLET: 150 | 1 days supply | Qty: 1 | Fill #0

## 2019-11-04 NOTE — Progress Notes (Signed)
Patient ID: Rachel Lucero, female   DOB: 26-Sep-1990, 29 y.o.   MRN: [id number]     Rachel Lucero, is a 29 y.o. female  OBS:[id number]  UTM:[id number]  DOB - 07/03/1990  Subjective:  Chief Complaint and HPI: Rachel Lucero is a 29 y.o. female here today Patient here for repeat pap bc last one had unsatifactory celss for examination.  She is also c/o urinary frequency and dysuria along with vaginal discharge and itchiness.  No pelvic pain, no fever.  Took metronidazole in June  Interpreter used   ROS:   Constitutional:  No f/c, No night sweats, No unexplained weight loss. EENT:  No vision changes, No blurry vision, No hearing changes. No mouth, throat, or ear problems.  Respiratory: No cough, No SOB Cardiac: No CP, no palpitations GI:  No abd pain, No N/V/D. GU: see above Musculoskeletal: No joint pain Neuro: No headache, no dizziness, no motor weakness.  Skin: No rash Endocrine:  No polydipsia. No polyuria.  Psych: Denies SI/HI  No problems updated.  ALLERGIES: No Known Allergies  PAST MEDICAL HISTORY: Past Medical History:  Diagnosis Date  . Taenia saginata infection 2016    MEDICATIONS AT HOME: Prior to Admission medications   Medication Sig Start Date End Date Taking? Authorizing Provider  fluconazole (DIFLUCAN) 150 MG tablet Take 1 tablet (150 mg total) by mouth once for 1 dose. 11/04/19 11/04/19  Anders Simmonds, PA-C  ibuprofen (ADVIL) 600 MG tablet Take 1 tablet (600 mg total) by mouth every 6 (six) hours as needed. Patient not taking: Reported on 07/14/2019 06/24/19   Moshe Cipro, NP     Objective:  EXAM:   Vitals:   11/04/19 0927  BP: 118/80  Pulse: 80  Temp: 97.7 F (36.5 C)  TempSrc: Temporal  SpO2: 99%  Weight: 128 lb (58.1 kg)    General appearance : A&OX3. NAD. Non-toxic-appearing HEENT: Atraumatic and Normocephalic.  PERRLA. EOM intact.   Chest/Lungs:  Breathing-non-labored, Good air entry bilaterally, breath sounds  normal without rales, rhonchi, or wheezing  CVS: S1 S2 regular, no murmurs, gallops, rubs  GU:  Speculum inserted;  Thick and clumpy vaginal d/c.  Cervix visible and pap taken.  Cervix friable and bled a little.  Swabs also taken.  Extremities: Bilateral Lower Ext shows no edema, both legs are warm to touch with = pulse throughout Neurology:  CN II-XII grossly intact, Non focal.   Psych:  TP linear. J/I WNL. Normal speech. Appropriate eye contact and affect.  Skin:  No Rash  Data Review Lab Results  Component Value Date   HGBA1C 5.4 08/27/2019     Assessment & Plan   1. Vaginal discharge Suspect yeast due to antibiotics about 6 weeks ago - Cervicovaginal ancillary only - Cytology - PAP([place]) - Glucose (CBG)-not diabetic - fluconazole (DIFLUCAN) 150 MG tablet; Take 1 tablet (150 mg total) by mouth once for 1 dose.  Dispense: 1 tablet; Refill: 0  2. Dysuria Likely due to #1 - POCT URINALYSIS DIP (CLINITEK)  3. Abnormal cervical Papanicolaou smear, unspecified abnormal pap finding - Cervicovaginal ancillary only - Cytology - PAP([place])  4. Language barrier [employer] interpreters used and additional time performing visit was required.    Patient have been counseled extensively about nutrition and exercise  Return if symptoms worsen or fail to improve.  The patient was given clear instructions to go to ER or return to medical center if symptoms don't improve, worsen or new problems develop. The patient verbalized understanding. The patient  was told to call to get lab results if they haven't heard anything in the next week.     Georgian Co, PA-C [facility] and [facility] [place], [place] [phone]   11/04/2019, 9:52 AM

## 2019-11-05 LAB — CERVICOVAGINAL ANCILLARY ONLY
Bacterial Vaginitis (gardnerella): POSITIVE — AB
Candida Glabrata: NEGATIVE
Candida Vaginitis: POSITIVE — AB
Chlamydia: NEGATIVE
Comment: NEGATIVE
Comment: NEGATIVE
Comment: NEGATIVE
Comment: NEGATIVE
Comment: NEGATIVE
Comment: NORMAL
Neisseria Gonorrhea: NEGATIVE
Trichomonas: NEGATIVE

## 2019-11-05 LAB — CYTOLOGY - PAP: Diagnosis: NEGATIVE

## 2019-11-08 ENCOUNTER — Other Ambulatory Visit: Payer: Self-pay | Admitting: Physician Assistant

## 2019-11-08 ENCOUNTER — Encounter: Payer: Self-pay | Admitting: *Deleted

## 2019-11-08 MED ORDER — METRONIDAZOLE 500 MG PO TABS: 500.0000 mg | ORAL_TABLET | Freq: Two times a day (BID) | ORAL | 0 refills | Status: DC

## 2019-11-08 MED ORDER — FLUCONAZOLE 150 MG PO TABS: 150.0000 mg | ORAL_TABLET | Freq: Once | ORAL | 0 refills | Status: AC

## 2019-11-08 MED FILL — metroNIDAZOLE 500 MG TABS: 500 | 7 days supply | Qty: 14 | Fill #0

## 2019-11-08 MED FILL — FLUCONAZOLE 150 MG TABLET: 150 | 7 days supply | Qty: 2 | Fill #0

## 2019-11-29 ENCOUNTER — Ambulatory Visit: Payer: BC Managed Care – PPO | Admitting: Nurse Practitioner

## 2019-12-01 MED FILL — metroNIDAZOLE 500 MG TABS: 500 | 7 days supply | Qty: 14 | Fill #0

## 2019-12-01 MED FILL — FLUCONAZOLE 150 MG TABLET: 150 | 7 days supply | Qty: 2 | Fill #0

## 2020-03-21 ENCOUNTER — Ambulatory Visit (HOSPITAL_COMMUNITY)
Admission: EM | Admit: 2020-03-21 | Discharge: 2020-03-21 | Disposition: A | Discharge status: Home / Self Care | Payer: BC Managed Care – PPO | Attending: Emergency Medicine | Admitting: Emergency Medicine

## 2020-03-21 ENCOUNTER — Other Ambulatory Visit: Payer: Self-pay

## 2020-03-21 ENCOUNTER — Encounter (HOSPITAL_COMMUNITY): Payer: Self-pay

## 2020-03-21 DIAGNOSIS — J069 Acute upper respiratory infection, unspecified: Secondary | ICD-10-CM | POA: Diagnosis not present

## 2020-03-21 DIAGNOSIS — G43909 Migraine, unspecified, not intractable, without status migrainosus: Secondary | ICD-10-CM

## 2020-03-21 MED ORDER — PREDNISONE 10 MG (21) PO TBPK: ORAL_TABLET | Freq: Every day | ORAL | 0 refills | Status: DC

## 2020-03-21 NOTE — Discharge Instructions (Addendum)
You do not need antibiotics it appears to be viral in nature May take tylenol or motrin as needed for pain  Stay hydrated  May use humidifier to help

## 2020-03-21 NOTE — ED Provider Notes (Signed)
[facility]    CSN: [id number] Arrival date & time: 03/21/20  1727      History   Chief Complaint Chief Complaint  Patient presents with  . Cough  . Headache    HPI Rachel Lucero is a 30 y.o. female.   Cough, intermit headache, sore throat, abd pain for a few days, last week was LMP, , NO n/v/d no fevers. No sob, has not taken anything pta. interp line used      Past Medical History:  Diagnosis Date  . Taenia saginata infection 2016    Patient Active Problem List   Diagnosis Date Noted  . Otitis externa of left ear 01/02/2015  . 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 Vitamin D deficiency 05/24/2014  . Dizziness 05/23/2014  . 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 Allergic rhinitis 05/23/2014  . Menorrhagia with regular cycle 05/23/2014  . External hemorrhoid 05/23/2014    Past Surgical History:  Procedure Laterality Date  . NO PAST SURGERIES      OB History   No obstetric history on file.      Home Medications    Prior to Admission medications   Medication Sig Start Date End Date Taking? Authorizing Provider  predniSONE (STERAPRED UNI-PAK 21 TAB) 10 MG (21) TBPK tablet Take by mouth daily. Take 6 tabs by mouth daily  for 2 days, then 5 tabs for 2 days, then 4 tabs for 2 days, then 3 tabs for 2 days, 2 tabs for 2 days, then 1 tab by mouth daily for 2 days 03/21/20  Yes Maple Mirza L, NP  ibuprofen (ADVIL) 600 MG tablet Take 1 tablet (600 mg total) by mouth every 6 (six) hours as needed. Patient not taking: No sig reported 06/24/19   Moshe Cipro, NP  metroNIDAZOLE (FLAGYL) 500 MG tablet Take 1 tablet (500 mg total) by mouth 2 (two) times daily. 11/08/19   Anders Simmonds, PA-C    Family History Family History  Problem Relation Age of Onset  . Diabetes Neg Hx   . Hyperlipidemia Neg Hx     Social History Social History   Tobacco Use  . Smoking status: Never Smoker   . Smokeless tobacco: Never Used  Vaping Use  . Vaping Use: Never used  Substance Use Topics  . Alcohol use: No    Alcohol/week: 0.0 standard drinks  . Drug use: No     Allergies   Patient has no known allergies.   Review of Systems Review of Systems  Constitutional: Negative.   HENT: Positive for sore throat.   Eyes: Negative.   Respiratory: Positive for cough.   Cardiovascular: Negative.   Genitourinary: Negative.   Neurological: Positive for headaches.     Physical Exam Triage Vital Signs ED Triage Vitals  Enc Vitals Group     BP      Pulse      Resp      Temp      Temp src      SpO2      Weight      Height      Head Circumference      Peak Flow      Pain Score      Pain Loc      Pain Edu?      Excl. in [place]?    No data found.  Updated Vital Signs BP 111/72 (BP Location: Right Arm)   Pulse 94  Temp 98.3 F (36.8 C) (Oral)   Resp 18   LMP  (Within Weeks) Comment: 1 week   SpO2 100%   Visual Acuity     Physical Exam HENT:     Head: Normocephalic.     Mouth/Throat:     Mouth: Mucous membranes are moist.     Comments: Erythema  Eyes:     Pupils: Pupils are equal, round, and reactive to light.  Cardiovascular:     Rate and Rhythm: Normal rate.  Pulmonary:     Effort: Pulmonary effort is normal.  Abdominal:     Palpations: Abdomen is soft.  Musculoskeletal:     Cervical back: Normal range of motion.  Skin:    General: Skin is warm.  Neurological:     Mental Status: She is alert.      UC Treatments / Results  Labs (all labs ordered are listed, but only abnormal results are displayed) Labs Reviewed - No data to display  EKG   Radiology No results found.  Procedures Procedures (including critical care time)  Medications Ordered in UC Medications - No data to display  Initial Impression / Assessment and Plan / UC Course  I have reviewed the triage vital signs and the nursing notes.  Pertinent labs & imaging results that  were available during my care of the patient were reviewed by me and considered in my medical decision making (see chart for details).    You do not need antibiotics it appears to be viral in nature May take tylenol or motrin as needed for pain  Stay hydrated  May use humidifier to help  interp line used     Final Clinical Impressions(s) / UC Diagnoses   Final diagnoses:  Viral URI with cough  Migraine without status migrainosus, not intractable, unspecified migraine type     Discharge Instructions     You do not need antibiotics it appears to be viral in nature May take tylenol or motrin as needed for pain  Stay hydrated  May use humidifier to help      ED Prescriptions    Medication Sig Dispense Auth. Provider   predniSONE (STERAPRED UNI-PAK 21 TAB) 10 MG (21) TBPK tablet Take by mouth daily. Take 6 tabs by mouth daily  for 2 days, then 5 tabs for 2 days, then 4 tabs for 2 days, then 3 tabs for 2 days, 2 tabs for 2 days, then 1 tab by mouth daily for 2 days 42 tablet Coralyn Mark, NP     PDMP not reviewed this encounter.   Coralyn Mark, NP 03/21/20 1925

## 2020-03-21 NOTE — ED Triage Notes (Signed)
Pt presents with cough, headache, itchy  pain, axillar pain x 3 days.

## 2020-12-11 ENCOUNTER — Other Ambulatory Visit: Payer: Self-pay

## 2020-12-11 ENCOUNTER — Encounter: Payer: Self-pay | Admitting: Nurse Practitioner

## 2020-12-11 ENCOUNTER — Other Ambulatory Visit (HOSPITAL_COMMUNITY)
Admission: RE | Admit: 2020-12-11 | Discharge: 2020-12-11 | Disposition: A | Discharge status: Home / Self Care | Payer: BC Managed Care – PPO | Source: Ambulatory Visit | Attending: Nurse Practitioner | Admitting: Nurse Practitioner

## 2020-12-11 ENCOUNTER — Ambulatory Visit: Payer: BC Managed Care – PPO | Attending: Nurse Practitioner | Admitting: Nurse Practitioner

## 2020-12-11 ENCOUNTER — Other Ambulatory Visit: Payer: Self-pay | Admitting: Nurse Practitioner

## 2020-12-11 VITALS — BP 124/77 | HR 113 | Ht 62.0 in | Wt 132.2 lb

## 2020-12-11 DIAGNOSIS — R1084 Generalized abdominal pain: Secondary | ICD-10-CM | POA: Diagnosis not present

## 2020-12-11 DIAGNOSIS — Z3202 Encounter for pregnancy test, result negative: Secondary | ICD-10-CM

## 2020-12-11 DIAGNOSIS — R102 Pelvic and perineal pain: Secondary | ICD-10-CM | POA: Diagnosis not present

## 2020-12-11 DIAGNOSIS — R000 Tachycardia, unspecified: Secondary | ICD-10-CM

## 2020-12-11 DIAGNOSIS — Z131 Encounter for screening for diabetes mellitus: Secondary | ICD-10-CM

## 2020-12-11 DIAGNOSIS — Z23 Encounter for immunization: Secondary | ICD-10-CM | POA: Diagnosis not present

## 2020-12-11 DIAGNOSIS — N920 Excessive and frequent menstruation with regular cycle: Secondary | ICD-10-CM | POA: Diagnosis not present

## 2020-12-11 DIAGNOSIS — Z114 Encounter for screening for human immunodeficiency virus [HIV]: Secondary | ICD-10-CM

## 2020-12-11 LAB — POCT URINE PREGNANCY: Preg Test, Ur: NEGATIVE

## 2020-12-11 NOTE — Progress Notes (Signed)
Rachel Lucero [id number]

## 2020-12-11 NOTE — Progress Notes (Signed)
Assessment & Plan:  Rachel Lucero was seen today for abdominal pain.  Diagnoses and all orders for this visit:  Generalized abdominal pain -     H. pylori breath test  Pelvic pain -     CMP14+EGFR -     Cervicovaginal ancillary only -     POCT urine pregnancy -     Urinalysis, Complete  Menorrhagia with regular cycle -     Cancel: CBC -     CBC with Differential  Tachycardia -     Thyroid Panel With TSH -     CBC with Differential  Encounter for screening for diabetes mellitus -     Hemoglobin A1c  Encounter for screening for HIV -     HIV antibody (with reflex)  Need for immunization against influenza -     Flu Vaccine QUAD 55moIM (Fluarix, Fluzone & Alfiuria Quad PF)   Patient has been counseled on age-appropriate routine health concerns for screening and prevention. These are reviewed and up-to-date. Referrals have been placed accordingly. Immunizations are up-to-date or declined.    Subjective:   Chief Complaint  Patient presents with   Abdominal Pain   HPI Rachel Lucero 30y.o. female presents to office today with complaints of abdominal pain.  I have not seen this patient since June 2021 when she had her pap smear performed.   Abdominal Pain Onset 10 days ago. Starts in the lower pelvic area and then radiates to the entire abdomen. Last bowel movement was yesterday and she endorses daily bowel movements. Pain comes and goes. She states it feels like something is moving inside of her. She had 2 menstrual cycles in the month of August which is not normal for her.  She is sexually active. Pain is described as sharp and aching. Associated symptoms: watery mouth but no nausea or vomiting, "foamy" urine with foul odor. Denies chest but does endorse shortness of breath when climbing steps   Review of Systems  Constitutional:  Negative for fever, malaise/fatigue and weight loss.  HENT: Negative.  Negative for nosebleeds.   Eyes: Negative.  Negative for blurred  vision, double vision and photophobia.  Respiratory: Negative.  Negative for cough and shortness of breath.   Cardiovascular: Negative.  Negative for chest pain, palpitations and leg swelling.  Gastrointestinal:  Positive for abdominal pain. Negative for blood in stool, constipation, diarrhea, heartburn, melena, nausea and vomiting.  Genitourinary:        SEE HPI  Musculoskeletal: Negative.  Negative for myalgias.  Neurological: Negative.  Negative for dizziness, focal weakness, seizures and headaches.  Psychiatric/Behavioral: Negative.  Negative for suicidal ideas.    Past Medical History:  Diagnosis Date   Taenia saginata infection 2016    Past Surgical History:  Procedure Laterality Date   NO PAST SURGERIES      Family History  Problem Relation Age of Onset   Diabetes Neg Hx    Hyperlipidemia Neg Hx     Social History Reviewed with no changes to be made today.   Outpatient Medications Prior to Visit  Medication Sig Dispense Refill   ibuprofen (ADVIL) 600 MG tablet Take 1 tablet (600 mg total) by mouth every 6 (six) hours as needed. (Patient not taking: No sig reported) 30 tablet 0   metroNIDAZOLE (FLAGYL) 500 MG tablet Take 1 tablet (500 mg total) by mouth 2 (two) times daily. (Patient not taking: Reported on 12/11/2020) 14 tablet 0   predniSONE (STERAPRED UNI-PAK 21 TAB) 10 MG (21) TBPK  tablet Take by mouth daily. Take 6 tabs by mouth daily  for 2 days, then 5 tabs for 2 days, then 4 tabs for 2 days, then 3 tabs for 2 days, 2 tabs for 2 days, then 1 tab by mouth daily for 2 days (Patient not taking: Reported on 12/11/2020) 42 tablet 0   No facility-administered medications prior to visit.    No Known Allergies     Objective:    BP 124/77   Pulse (!) 113   Ht 5' 2"  (1.575 m)   Wt 132 lb 4 oz (60 kg)   LMP 11/15/2020 (Approximate)   SpO2 98%   BMI 24.19 kg/m  Wt Readings from Last 3 Encounters:  12/11/20 132 lb 4 oz (60 kg)  11/04/19 128 lb (58.1 kg)  08/27/19 128  lb 3.2 oz (58.2 kg)    Physical Exam Vitals and nursing note reviewed.  Constitutional:      Appearance: She is well-developed.  HENT:     Head: Normocephalic and atraumatic.  Cardiovascular:     Rate and Rhythm: Regular rhythm. Tachycardia present.     Heart sounds: Normal heart sounds. No murmur heard.   No friction rub. No gallop.  Pulmonary:     Effort: Pulmonary effort is normal. No tachypnea or respiratory distress.     Breath sounds: Normal breath sounds. No decreased breath sounds, wheezing, rhonchi or rales.  Chest:     Chest wall: No tenderness.  Abdominal:     General: Bowel sounds are normal.     Palpations: Abdomen is soft.     Tenderness: There is abdominal tenderness in the periumbilical area and suprapubic area. There is no right CVA tenderness or left CVA tenderness.     Hernia: No hernia is present.  Musculoskeletal:        General: Normal range of motion.     Cervical back: Normal range of motion.  Skin:    General: Skin is warm and dry.  Neurological:     Mental Status: She is alert and oriented to person, place, and time.     Coordination: Coordination normal.  Psychiatric:        Behavior: Behavior normal. Behavior is cooperative.        Thought Content: Thought content normal.        Judgment: Judgment normal.         Patient has been counseled extensively about nutrition and exercise as well as the importance of adherence with medications and regular follow-up. The patient was given clear instructions to go to ER or return to medical center if symptoms don't improve, worsen or new problems develop. The patient verbalized understanding.   Follow-up: Return if symptoms worsen or fail to improve.   Gildardo Pounds, FNP-BC [facility] [place], [place]   12/11/2020, 3:07 PM

## 2020-12-12 LAB — CMP14+EGFR
ALT: 12 IU/L (ref 0–32)
AST: 15 IU/L (ref 0–40)
Albumin/Globulin Ratio: 1.6 (ref 1.2–2.2)
Albumin: 4.6 g/dL (ref 3.8–4.8)
Alkaline Phosphatase: 88 IU/L (ref 44–121)
BUN/Creatinine Ratio: 12 (ref 9–23)
BUN: 9 mg/dL (ref 6–20)
Bilirubin Total: 0.4 mg/dL (ref 0.0–1.2)
CO2: 21 mmol/L (ref 20–29)
Calcium: 9.2 mg/dL (ref 8.7–10.2)
Chloride: 103 mmol/L (ref 96–106)
Creatinine, Ser: 0.74 mg/dL (ref 0.57–1.00)
Globulin, Total: 2.9 g/dL (ref 1.5–4.5)
Glucose: 100 mg/dL — ABNORMAL HIGH (ref 70–99)
Potassium: 4.5 mmol/L (ref 3.5–5.2)
Sodium: 141 mmol/L (ref 134–144)
Total Protein: 7.5 g/dL (ref 6.0–8.5)
eGFR: 111 mL/min/{1.73_m2} (ref >59)

## 2020-12-12 LAB — CBC WITH DIFFERENTIAL/PLATELET
Basophils Absolute: 0 10*3/uL (ref 0.0–0.2)
Basos: 1 %
EOS (ABSOLUTE): 0 10*3/uL (ref 0.0–0.4)
Eos: 1 %
Hematocrit: 43.1 % (ref 34.0–46.6)
Hemoglobin: 14 g/dL (ref 11.1–15.9)
Immature Grans (Abs): 0 10*3/uL (ref 0.0–0.1)
Immature Granulocytes: 1 %
Lymphocytes Absolute: 2 10*3/uL (ref 0.7–3.1)
Lymphs: 45 %
MCH: 28.7 pg (ref 26.6–33.0)
MCHC: 32.5 g/dL (ref 31.5–35.7)
MCV: 88 fL (ref 79–97)
Monocytes Absolute: 0.5 10*3/uL (ref 0.1–0.9)
Monocytes: 11 %
Neutrophils Absolute: 1.8 10*3/uL (ref 1.4–7.0)
Neutrophils: 41 %
Platelets: 229 10*3/uL (ref 150–450)
RBC: 4.88 x10E6/uL (ref 3.77–5.28)
RDW: 12.3 % (ref 11.7–15.4)
WBC: 4.3 10*3/uL (ref 3.4–10.8)

## 2020-12-12 LAB — MICROSCOPIC EXAMINATION
Bacteria, UA: NONE SEEN
Casts: NONE SEEN /lpf

## 2020-12-12 LAB — URINALYSIS, COMPLETE
Bilirubin, UA: NEGATIVE
Glucose, UA: NEGATIVE
Ketones, UA: NEGATIVE
Nitrite, UA: NEGATIVE
Specific Gravity, UA: 1.025 (ref 1.005–1.030)
Urobilinogen, Ur: 0.2 mg/dL (ref 0.2–1.0)
pH, UA: 5.5 (ref 5.0–7.5)

## 2020-12-12 LAB — THYROID PANEL WITH TSH
Free Thyroxine Index: 2.4 (ref 1.2–4.9)
T3 Uptake Ratio: 25 % (ref 24–39)
T4, Total: 9.7 ug/dL (ref 4.5–12.0)
TSH: 1.94 u[IU]/mL (ref 0.450–4.500)

## 2020-12-12 LAB — HEMOGLOBIN A1C
Est. average glucose Bld gHb Est-mCnc: 120 mg/dL
Hgb A1c MFr Bld: 5.8 % — ABNORMAL HIGH (ref 4.8–5.6)

## 2020-12-12 LAB — HIV ANTIBODY (ROUTINE TESTING W REFLEX): HIV Screen 4th Generation wRfx: NONREACTIVE

## 2020-12-12 LAB — H. PYLORI BREATH TEST: H pylori Breath Test: POSITIVE — AB

## 2020-12-13 LAB — CERVICOVAGINAL ANCILLARY ONLY
Bacterial Vaginitis (gardnerella): NEGATIVE
Candida Glabrata: NEGATIVE
Candida Vaginitis: POSITIVE — AB
Chlamydia: NEGATIVE
Comment: NEGATIVE
Comment: NEGATIVE
Comment: NEGATIVE
Comment: NEGATIVE
Comment: NEGATIVE
Comment: NORMAL
Neisseria Gonorrhea: NEGATIVE
Trichomonas: NEGATIVE

## 2020-12-14 ENCOUNTER — Other Ambulatory Visit: Payer: Self-pay | Admitting: Nurse Practitioner

## 2020-12-14 DIAGNOSIS — A048 Other specified bacterial intestinal infections: Secondary | ICD-10-CM

## 2020-12-14 MED ORDER — FLUCONAZOLE 150 MG PO TABS
150.0000 mg | ORAL_TABLET | Freq: Once | ORAL | 1 refills | Status: AC
  Filled 2020-12-14: qty 1, 1d supply, fill #0

## 2020-12-14 MED ORDER — CLARITHROMYCIN 500 MG PO TABS
500.0000 mg | ORAL_TABLET | Freq: Two times a day (BID) | ORAL | 0 refills | Status: AC
  Filled 2020-12-14: qty 28, 14d supply, fill #0

## 2020-12-14 MED ORDER — AMOXICILLIN 500 MG PO CAPS
1000.0000 mg | ORAL_CAPSULE | Freq: Two times a day (BID) | ORAL | 0 refills | Status: AC
  Filled 2020-12-14: qty 56, 14d supply, fill #0

## 2020-12-14 MED ORDER — OMEPRAZOLE 20 MG PO CPDR
20.0000 mg | DELAYED_RELEASE_CAPSULE | Freq: Two times a day (BID) | ORAL | 0 refills | Status: AC
  Filled 2020-12-14: qty 28, 14d supply, fill #0

## 2020-12-15 ENCOUNTER — Telehealth: Payer: Self-pay

## 2020-12-15 ENCOUNTER — Other Ambulatory Visit: Payer: Self-pay

## 2020-12-15 NOTE — Telephone Encounter (Signed)
-----   Message from Claiborne Rigg, NP sent at 12/14/2020 10:38 PM EDT ----- H pylori positive. Will send 3 medications that you will need to take for the next few weeks to help kill the bacteria in your stomach. ALSO NEEDS TO HAVE URINE REPEATED when she is not on her menstrual cycle. HIV negative. CBC does not indicate any anemia or bleeding disorders. Kidney, liver function and electrolytes are normal.  Thyroid normal. A1c in prediabetes range. Vaginal swab shows yeast. Will send diflucan as well. She can take this after she finishes the h pylori treatment.

## 2020-12-18 ENCOUNTER — Telehealth: Payer: Self-pay

## 2020-12-18 ENCOUNTER — Other Ambulatory Visit: Payer: Self-pay

## 2020-12-18 NOTE — Telephone Encounter (Signed)
-----   Message from Zelda W Fleming, NP sent at 12/14/2020 10:38 PM EDT ----- H pylori positive. Will send 3 medications that you will need to take for the next few weeks to help kill the bacteria in your stomach. ALSO NEEDS TO HAVE URINE REPEATED when she is not on her menstrual cycle. HIV negative. CBC does not indicate any anemia or bleeding disorders. Kidney, liver function and electrolytes are normal.  Thyroid normal. A1c in prediabetes range. Vaginal swab shows yeast. Will send diflucan as well. She can take this after she finishes the h pylori treatment.  

## 2021-02-05 ENCOUNTER — Ambulatory Visit (HOSPITAL_COMMUNITY)
Admission: EM | Admit: 2021-02-05 | Discharge: 2021-02-05 | Disposition: A | Discharge status: Home / Self Care | Payer: BC Managed Care – PPO | Attending: Urgent Care | Admitting: Urgent Care

## 2021-02-05 ENCOUNTER — Encounter (HOSPITAL_COMMUNITY): Payer: Self-pay | Admitting: Emergency Medicine

## 2021-02-05 ENCOUNTER — Other Ambulatory Visit: Payer: Self-pay

## 2021-02-05 DIAGNOSIS — Z8619 Personal history of other infectious and parasitic diseases: Secondary | ICD-10-CM | POA: Diagnosis not present

## 2021-02-05 DIAGNOSIS — R8290 Unspecified abnormal findings in urine: Secondary | ICD-10-CM | POA: Insufficient documentation

## 2021-02-05 DIAGNOSIS — R102 Pelvic and perineal pain: Secondary | ICD-10-CM | POA: Insufficient documentation

## 2021-02-05 LAB — POCT URINALYSIS DIPSTICK, ED / UC
Bilirubin Urine: NEGATIVE
Glucose, UA: NEGATIVE mg/dL
Hgb urine dipstick: NEGATIVE
Leukocytes,Ua: NEGATIVE
Nitrite: NEGATIVE
Protein, ur: NEGATIVE mg/dL
Specific Gravity, Urine: 1.02 (ref 1.005–1.030)
Urobilinogen, UA: 0.2 mg/dL (ref 0.0–1.0)
pH: 7 (ref 5.0–8.0)

## 2021-02-05 LAB — POC URINE PREG, ED: Preg Test, Ur: NEGATIVE

## 2021-02-05 MED ORDER — NAPROXEN 500 MG PO TABS: 500.0000 mg | ORAL_TABLET | Freq: Two times a day (BID) | ORAL | 0 refills | Status: AC

## 2021-02-05 NOTE — Discharge Instructions (Addendum)
Please use naproxen for pain and inflammation in the pelvic area. Contact the [facility] for a follow up appointment and to establish care. We will let you know if your test is positive for bacterial vaginosis and will treat you for this as a source of your abdominal pain.

## 2021-02-05 NOTE — ED Provider Notes (Signed)
[facility]   MRN: [id number] DOB: [birth year]  Subjective:   Rachel Lucero is a 30 y.o. female presenting for 1 week history of abdominal bloating, pelvic pain on either side. Has had strong odor to the urine. Had her period less than a month ago. Denies fever, n/v, dysuria, hematuria. No history of fibroids, ovarian cysts. Does not see a gynecologist. Of note, she completed treatment for H. pylori last month. No diarrhea, bloody stools. Has a history of bacterial vaginosis, yeast infections.   No current facility-administered medications for this encounter.  Current Outpatient Medications:    omeprazole (PRILOSEC) 20 MG capsule, Take 1 capsule (20 mg total) by mouth 2 (two) times daily before a meal for 14 days., Disp: 28 capsule, Rfl: 0   No Known Allergies  Past Medical History:  Diagnosis Date   Taenia saginata infection 2016     Past Surgical History:  Procedure Laterality Date   NO PAST SURGERIES      Family History  Problem Relation Age of Onset   Diabetes Neg Hx    Hyperlipidemia Neg Hx     Social History   Tobacco Use   Smoking status: Never   Smokeless tobacco: Never  Vaping Use   Vaping Use: Never used  Substance Use Topics   Alcohol use: No    Alcohol/week: 0.0 standard drinks   Drug use: No    ROS   Objective:   Vitals: BP 127/88 (BP Location: Left Arm)   Pulse 83   Temp 98.2 F (36.8 C) (Oral)   Resp 17   LMP 01/22/2021   SpO2 98%   Physical Exam Constitutional:      General: She is not in acute distress.    Appearance: Normal appearance. She is well-developed. She is not ill-appearing, toxic-appearing or diaphoretic.  HENT:     Head: Normocephalic and atraumatic.     Nose: Nose normal.     Mouth/Throat:     Mouth: Mucous membranes are moist.     Pharynx: Oropharynx is clear.  Eyes:     General: No scleral icterus.       Right eye: No discharge.        Left eye: No discharge.     Extraocular Movements:  Extraocular movements intact.     Conjunctiva/sclera: Conjunctivae normal.     Pupils: Pupils are equal, round, and reactive to light.  Cardiovascular:     Rate and Rhythm: Normal rate and regular rhythm.     Pulses: Normal pulses.     Heart sounds: Normal heart sounds. No murmur heard.   No friction rub. No gallop.  Pulmonary:     Effort: Pulmonary effort is normal. No respiratory distress.     Breath sounds: Normal breath sounds. No stridor. No wheezing, rhonchi or rales.  Abdominal:     General: Bowel sounds are normal. There is no distension.     Palpations: Abdomen is soft. There is no mass.     Tenderness: There is abdominal tenderness (on either side) in the suprapubic area. There is no right CVA tenderness, left CVA tenderness, guarding or rebound.  Skin:    General: Skin is warm and dry.     Findings: No rash.  Neurological:     General: No focal deficit present.     Mental Status: She is alert and oriented to person, place, and time.  Psychiatric:        Mood and Affect: Mood normal.  Behavior: Behavior normal.        Thought Content: Thought content normal.        Judgment: Judgment normal.    Results for orders placed or performed during the hospital encounter of 02/05/21 (from the past 24 hour(s))  POC Urinalysis dipstick     Status: Abnormal   Collection Time: 02/05/21  5:45 PM  Result Value Ref Range   Glucose, UA NEGATIVE NEGATIVE mg/dL   Bilirubin Urine NEGATIVE NEGATIVE   Ketones, ur TRACE (A) NEGATIVE mg/dL   Specific Gravity, Urine 1.020 1.005 - 1.030   Hgb urine dipstick NEGATIVE NEGATIVE   pH 7.0 5.0 - 8.0   Protein, ur NEGATIVE NEGATIVE mg/dL   Urobilinogen, UA 0.2 0.0 - 1.0 mg/dL   Nitrite NEGATIVE NEGATIVE   Leukocytes,Ua NEGATIVE NEGATIVE  POC urine pregnancy     Status: None   Collection Time: 02/05/21  5:50 PM  Result Value Ref Range   Preg Test, Ur NEGATIVE NEGATIVE    Assessment and Plan :   PDMP not reviewed this encounter.  1.  Pelvic pain in female   2. History of Helicobacter pylori infection   3. Abnormal urine odor    Low suspicion for suspicion for PID, acute gynecologic process.  Cervical swab test results pending.  Counseled on the possibility of recurrent bacterial vaginosis at the source of her pelvic discomfort.  Recommended naproxen in the meantime for pain and inflammation.  Establish care with a gynecologist to have a full work-up including pelvic and transvaginal ultrasound. Counseled patient on potential for adverse effects with medications prescribed/recommended today, ER and return-to-clinic precautions discussed, patient verbalized understanding.    Jaynee Eagles, [place] 02/05/21 [id number]

## 2021-02-05 NOTE — ED Triage Notes (Signed)
For week having abd swelling abd pains. Yesterday having headache. Denies n/v or bowel or urinary problems.

## 2021-02-06 LAB — CERVICOVAGINAL ANCILLARY ONLY
Bacterial Vaginitis (gardnerella): NEGATIVE
Candida Glabrata: NEGATIVE
Candida Vaginitis: NEGATIVE
Chlamydia: NEGATIVE
Comment: NEGATIVE
Comment: NEGATIVE
Comment: NEGATIVE
Comment: NEGATIVE
Comment: NEGATIVE
Comment: NORMAL
Neisseria Gonorrhea: NEGATIVE
Trichomonas: NEGATIVE

## 2022-09-13 ENCOUNTER — Other Ambulatory Visit: Payer: Self-pay

## 2022-09-13 ENCOUNTER — Encounter (HOSPITAL_COMMUNITY): Payer: Self-pay | Admitting: *Deleted

## 2022-09-13 ENCOUNTER — Emergency Department (HOSPITAL_COMMUNITY)
Admission: EM | Admit: 2022-09-13 | Discharge: 2022-09-14 | Disposition: A | Discharge status: Home / Self Care | Payer: 59 | Attending: Emergency Medicine | Admitting: Emergency Medicine

## 2022-09-13 DIAGNOSIS — R1084 Generalized abdominal pain: Secondary | ICD-10-CM | POA: Insufficient documentation

## 2022-09-13 DIAGNOSIS — R350 Frequency of micturition: Secondary | ICD-10-CM | POA: Insufficient documentation

## 2022-09-13 DIAGNOSIS — R519 Headache, unspecified: Secondary | ICD-10-CM | POA: Insufficient documentation

## 2022-09-13 DIAGNOSIS — R1030 Lower abdominal pain, unspecified: Secondary | ICD-10-CM | POA: Diagnosis present

## 2022-09-13 LAB — COMPREHENSIVE METABOLIC PANEL
ALT: 16 U/L (ref 0–44)
AST: 17 U/L (ref 15–41)
Albumin: 3.6 g/dL (ref 3.5–5.0)
Alkaline Phosphatase: 79 U/L (ref 38–126)
Anion gap: 8 (ref 5–15)
BUN: 9 mg/dL (ref 6–20)
CO2: 23 mmol/L (ref 22–32)
Calcium: 8.7 mg/dL — ABNORMAL LOW (ref 8.9–10.3)
Chloride: 104 mmol/L (ref 98–111)
Creatinine, Ser: 0.56 mg/dL (ref 0.44–1.00)
GFR, Estimated: >60 mL/min (ref >60)
Glucose, Bld: 125 mg/dL — ABNORMAL HIGH (ref 70–99)
Potassium: 3.8 mmol/L (ref 3.5–5.1)
Sodium: 135 mmol/L (ref 135–145)
Total Bilirubin: 0.6 mg/dL (ref 0.3–1.2)
Total Protein: 7 g/dL (ref 6.5–8.1)

## 2022-09-13 LAB — CBC
HCT: 38.2 % (ref 36.0–46.0)
Hemoglobin: 12.3 g/dL (ref 12.0–15.0)
MCH: 28.5 pg (ref 26.0–34.0)
MCHC: 32.2 g/dL (ref 30.0–36.0)
MCV: 88.6 fL (ref 80.0–100.0)
Platelets: 234 10*3/uL (ref 150–400)
RBC: 4.31 MIL/uL (ref 3.87–5.11)
RDW: 13.2 % (ref 11.5–15.5)
WBC: 4.7 10*3/uL (ref 4.0–10.5)
nRBC: 0 % (ref 0.0–0.2)

## 2022-09-13 LAB — URINALYSIS, ROUTINE W REFLEX MICROSCOPIC
Bilirubin Urine: NEGATIVE
Glucose, UA: NEGATIVE mg/dL
Hgb urine dipstick: NEGATIVE
Ketones, ur: NEGATIVE mg/dL
Leukocytes,Ua: NEGATIVE
Nitrite: NEGATIVE
Protein, ur: NEGATIVE mg/dL
Specific Gravity, Urine: 1.012 (ref 1.005–1.030)
pH: 5 (ref 5.0–8.0)

## 2022-09-13 LAB — LIPASE, BLOOD: Lipase: 41 U/L (ref 11–51)

## 2022-09-13 LAB — HCG, SERUM, QUALITATIVE: Preg, Serum: NEGATIVE

## 2022-09-13 NOTE — ED Triage Notes (Signed)
The pt is c/o abd pain  dry throat headache and feet pain for 3 days   this week

## 2022-09-14 ENCOUNTER — Emergency Department (HOSPITAL_COMMUNITY): Payer: 59

## 2022-09-14 MED ORDER — KETOROLAC TROMETHAMINE 15 MG/ML IJ SOLN
15.0000 mg | Freq: Once | INTRAMUSCULAR | Status: AC
  Administered 2022-09-14: 15 mg via INTRAVENOUS
  Filled 2022-09-14: qty 1

## 2022-09-14 MED ORDER — IOHEXOL 350 MG/ML SOLN
75.0000 mL | Freq: Once | INTRAVENOUS | Status: AC | PRN
  Administered 2022-09-14: 75 mL via INTRAVENOUS

## 2022-09-14 NOTE — ED Provider Notes (Signed)
[place] EMERGENCY DEPARTMENT AT [facility] Provider Note   CSN: [id number] Arrival date & time: 09/13/22  2034     History  Chief Complaint  Patient presents with   Urinary Frequency    Dark colored and bad odor noted per patient x3 days. Lower abd pain   Attempted to utilize Tig[place] interpreter via [employer] services, however poor connectivity.  Ultimately history provided by the patient in broken English given our inability to secure interpreter for native language. Rachel Lucero is a 32 y.o. female who presents with days of lower abdominal pain that is "strong" in nature.  Very uncomfortable for the patient.  Currently experiencing her menstrual cycle.  Patient states that this is new for her.  Denies nausea vomiting or diarrhea.  Endorses normal urinary and bowel habits without hematuria.  Endorses headache as well.  No cough, chest pain, or shortness of breath.  No back pain.  I personally reviewed medical records previous history of latent TB, tapeworms.  Has not left the [place] and 5 years.  Denies known ill contacts.  HPI     Home Medications Prior to Admission medications   Medication Sig Start Date End Date Taking? Authorizing Provider  naproxen (NAPROSYN) 500 MG tablet Take 1 tablet (500 mg total) by mouth 2 (two) times daily with a meal. 02/05/21   Wallis Bamberg, PA-C  omeprazole (PRILOSEC) 20 MG capsule Take 1 capsule (20 mg total) by mouth 2 (two) times daily before a meal for 14 days. 12/14/20 12/28/20  Claiborne Rigg, NP      Allergies    Patient has no known allergies.    Review of Systems   Review of Systems  Constitutional:  Positive for appetite change. Negative for activity change.  HENT: Negative.    Eyes: Negative.   Respiratory: Negative.    Gastrointestinal:  Positive for abdominal pain.  Genitourinary:  Positive for vaginal bleeding.  Neurological:  Positive for headaches. Negative for dizziness and light-headedness.     Physical Exam Updated Vital Signs BP 109/74   Pulse 85   Temp 98.2 F (36.8 C) (Oral)   Resp 17   Ht 5\' 2"  (1.575 m)   Wt 60 kg   LMP 09/08/2022   SpO2 100%   BMI 24.19 kg/m  Physical Exam Vitals and nursing note reviewed.  Constitutional:      Appearance: She is not ill-appearing or toxic-appearing.  HENT:     Head: Normocephalic and atraumatic.     Mouth/Throat:     Mouth: Mucous membranes are moist.     Pharynx: No oropharyngeal exudate or posterior oropharyngeal erythema.  Eyes:     General:        Right eye: No discharge.        Left eye: No discharge.     Conjunctiva/sclera: Conjunctivae normal.  Cardiovascular:     Rate and Rhythm: Normal rate and regular rhythm.     Pulses: Normal pulses.     Heart sounds: Normal heart sounds. No murmur heard. Pulmonary:     Effort: Pulmonary effort is normal. No respiratory distress.     Breath sounds: Normal breath sounds. No wheezing or rales.  Abdominal:     General: Bowel sounds are normal. There is no distension.     Palpations: Abdomen is soft.     Tenderness: There is abdominal tenderness in the right lower quadrant, periumbilical area, suprapubic area and left lower quadrant. There is no right CVA tenderness, left CVA  tenderness, guarding or rebound.  Musculoskeletal:        General: No deformity.     Cervical back: Neck supple.  Skin:    General: Skin is warm and dry.  Neurological:     Mental Status: She is alert. Mental status is at baseline.  Psychiatric:        Mood and Affect: Mood normal.     ED Results / Procedures / Treatments   Labs (all labs ordered are listed, but only abnormal results are displayed) Labs Reviewed  COMPREHENSIVE METABOLIC PANEL - Abnormal; Notable for the following components:      Result Value   Glucose, Bld 125 (*)    Calcium 8.7 (*)    All other components within normal limits  URINALYSIS, ROUTINE W REFLEX MICROSCOPIC - Abnormal; Notable for the following components:    APPearance HAZY (*)    Bacteria, UA MANY (*)    All other components within normal limits  LIPASE, BLOOD  CBC  HCG, SERUM, QUALITATIVE    EKG None  Radiology CT ABDOMEN PELVIS W CONTRAST  Result Date: 09/14/2022 CLINICAL DATA:  32 year old female with history of right lower quadrant abdominal pain. EXAM: CT ABDOMEN AND PELVIS WITH CONTRAST TECHNIQUE: Multidetector CT imaging of the abdomen and pelvis was performed using the standard protocol following bolus administration of intravenous contrast. RADIATION DOSE REDUCTION: This exam was performed according to the departmental dose-optimization program which includes automated exposure control, adjustment of the mA and/or kV according to patient size and/or use of iterative reconstruction technique. CONTRAST:  75mL OMNIPAQUE IOHEXOL 350 MG/ML SOLN COMPARISON:  No priors. FINDINGS: Lower chest: Unremarkable. Hepatobiliary: No suspicious cystic or solid hepatic lesions. No intra or extrahepatic biliary ductal dilatation. Gallbladder is unremarkable in appearance. Pancreas: No pancreatic mass. No pancreatic ductal dilatation. No pancreatic or peripancreatic fluid collections or inflammatory changes. Spleen: Unremarkable. Adrenals/Urinary Tract: Bilateral kidneys and adrenal glands are normal in appearance. No hydroureteronephrosis. Urinary bladder is normal in appearance. Stomach/Bowel: The appearance of the stomach is normal. No pathologic dilatation of small bowel or colon. The appendix is not confidently identified and may be surgically absent. Regardless, there are no inflammatory changes noted adjacent to the cecum to suggest the presence of an acute appendicitis at this time. Vascular/Lymphatic: No significant atherosclerotic disease, aneurysm or dissection noted in the abdominal or pelvic vasculature. No lymphadenopathy noted in the abdomen or pelvis. Reproductive: Uterus is enlarged and very heterogeneous in appearance with multiple heterogeneously  enhancing lesions scattered throughout the body and fundus, compatible with innumerable fibroids, largest of which on the right side measures 4.4 x 3.2 cm (axial image 51 of series 3). Ovaries are unremarkable in appearance. Other: No significant volume of ascites.  No pneumoperitoneum. Musculoskeletal: There are no aggressive appearing lytic or blastic lesions noted in the visualized portions of the skeleton. IMPRESSION: 1. No acute findings are noted in the abdomen or pelvis to account for the patient's symptoms. 2. Fibroid uterus. Electronically Signed   By: Trudie Reed M.D.   On: 09/14/2022 05:11    Procedures Procedures    Medications Ordered in ED Medications  ketorolac (TORADOL) 15 MG/ML injection 15 mg (15 mg Intravenous Given 09/14/22 0410)  iohexol (OMNIPAQUE) 350 MG/ML injection 75 mL (75 mLs Intravenous Contrast Given 09/14/22 0432)    ED Course/ Medical Decision Making/ A&P  Medical Decision Making 32 y/o female with abdominal pain.   VS reassuring. Cardiopulmonary exam is unremarkable. Abdominal exam as above.   The differential diagnosis for RLQ includes but is not limited to:  Cholelithiasis / choledocholithiasis / cholecystitis / cholangitis, hepatitis (eg. viral, alcoholic, toxic),liver abscess, pancreatitis, liver / pancreatic / biliary tract cancer, ischemic hepatopathy (shock liver), hepatic vein obstruction (Budd-Chiari syndrome), liver cell adenoma, peptic ulcer disease (duodenal), functional or nonulcer dyspepsia, right lower lobe pneumonia, pyelonephritis, urinary calculi,  Fitz-Hugh-Curtis syndrome (with pelvic inflammatory disease), herpes zoster, trauma or musculoskeletal pain, herniated disk, abdominal abscess, intestinal ischemia, physical or sexual abuse, ectopic pregnancy, IUP, Mittelschmerz, ovarian cyst/torsion, threatened/ievitable abortion, PID, endometriosis, molar pregnancy, heterotopic pregnancy, corpus luteum cyst,  appendicitis, UTI/renal colic, IBD.    Amount and/or Complexity of Data Reviewed Labs:     Details: CBC without leukocytosis, CMP unremarkable.  Patient is not pregnant.  UA unconvincing for infection.  There are many bacteria but also many squamous epithelial cells. Radiology: ordered.    Details: CT abdomen pelvis negative for acute intra-abdominal pelvic any.  Fibroid uterus.  Risk Prescription drug management.   Patient pain after Toradol.  Fibroid uterus on exam, clinical picture of pain worsened with menstruation, intermittent suggestive of pain secondary to uterine fibroids.  No acute abnormality or emergent etiology identified on workup today.  Patient hemodynamically stable, well-appearing, tolerating p.o.  Clinical concern for emergent underlying condition that would warrant further ED workup or inpatient management is exceedingly low.  Recommend PCP follow-up, GYN contact information also provided.  Railyn voiced understanding of her medical evaluation and treatment plan. Each of their questions answered to their expressed satisfaction.  Return precautions were given.  Patient is well-appearing, stable, and was discharged in good condition.  This chart was dictated using voice recognition software, Dragon. Despite the best efforts of this provider to proofread and correct errors, errors may still occur which can change documentation meaning.  Level 5 caveat due to language barrier and no access to interpreter.     Final Clinical Impression(s) / ED Diagnoses Final diagnoses:  Generalized abdominal pain    Rx / DC Orders ED Discharge Orders     None         Paris Lore, PA-C 09/14/22 0705    Gilda Crease, MD 09/16/22 [phone]

## 2022-09-14 NOTE — Discharge Instructions (Signed)
You were seen in the ER today for your abdominal pain.  It is likely related to the fibroids in your uterus.  There is no dangerous problem identified today.  May follow-up with the OB/GYN listed below.  You may take Tylenol or ibuprofen as needed for discomfort.  Return to the ER with any new severe symptoms.

## 2023-01-23 ENCOUNTER — Ambulatory Visit (INDEPENDENT_AMBULATORY_CARE_PROVIDER_SITE_OTHER): Payer: 59 | Admitting: Emergency Medicine

## 2023-01-23 VITALS — BP 117/79 | HR 109 | Wt 127.0 lb

## 2023-01-23 DIAGNOSIS — Z3202 Encounter for pregnancy test, result negative: Secondary | ICD-10-CM | POA: Diagnosis not present

## 2023-01-23 DIAGNOSIS — N912 Amenorrhea, unspecified: Secondary | ICD-10-CM

## 2023-01-23 DIAGNOSIS — Z3200 Encounter for pregnancy test, result unknown: Secondary | ICD-10-CM

## 2023-01-23 LAB — POCT URINE PREGNANCY: Preg Test, Ur: NEGATIVE

## 2023-01-23 NOTE — Progress Notes (Signed)
Rachel Lucero presents today for UPT. She has complaints of tender breast and bloating in abdomen. LMP: 12/08/22    OBJECTIVE: Appears well, in no apparent distress.  OB History   No obstetric history on file.    Home UPT Result: Reports 2 Positive tests at home In-Office UPT result: NEGATIVE in office  I have reviewed the patient's medical, obstetrical, social, and family histories, and medications.   ASSESSMENT: Negative  pregnancy test in office.   PLAN RTC in one week for repeat UPT

## 2023-01-30 ENCOUNTER — Ambulatory Visit: Payer: 59 | Admitting: Emergency Medicine

## 2023-01-30 VITALS — BP 138/86 | HR 106 | Ht 62.0 in | Wt 127.0 lb

## 2023-01-30 DIAGNOSIS — Z3202 Encounter for pregnancy test, result negative: Secondary | ICD-10-CM

## 2023-01-30 LAB — POCT URINE PREGNANCY: Preg Test, Ur: NEGATIVE

## 2023-01-30 NOTE — Progress Notes (Signed)
Pt presents for UPT. Reports 2 + UPT at home.  UPT negative in office again.  Beta HCG drawn per Debroah Loop, MD.

## 2023-01-31 LAB — BETA HCG QUANT (REF LAB): hCG Quant: <1 m[IU]/mL

## 2023-02-11 ENCOUNTER — Ambulatory Visit: Payer: 59 | Admitting: *Deleted

## 2023-02-11 DIAGNOSIS — N912 Amenorrhea, unspecified: Secondary | ICD-10-CM

## 2023-02-11 DIAGNOSIS — Z3202 Encounter for pregnancy test, result negative: Secondary | ICD-10-CM

## 2023-02-11 NOTE — Progress Notes (Signed)
Pt has a significant language barrier. Despite previous negative office UPTs and negative bHCG, pt presented for OB Intake and [place]. Telephone Tigrinian interpreter used to explain the above to patient again. Pt reports that she did have a small amount of bleeding following the last negative UPT and negative bHCG, but reports it was not the same as her bleeding when she has her period. Offered provider visit to discuss changes in menstrual cycle. Pt agreeable.

## 2023-02-12 NOTE — Progress Notes (Signed)
Pt seen in the office 02/11/23. Results reviewed with patient. Pt concerned that pregnancy test at home was positive. Reassured pt that bHCG result is more accurate than home UPT. Pt concerned for missed periods. Pt scheduled appt to discuss missed periods with a provider.

## 2023-02-20 ENCOUNTER — Other Ambulatory Visit (HOSPITAL_COMMUNITY)
Admission: RE | Admit: 2023-02-20 | Discharge: 2023-02-20 | Disposition: A | Discharge status: Home / Self Care | Payer: 59 | Source: Ambulatory Visit

## 2023-02-20 ENCOUNTER — Ambulatory Visit: Payer: 59

## 2023-02-20 VITALS — BP 136/86 | HR 110 | Wt 131.0 lb

## 2023-02-20 DIAGNOSIS — Z124 Encounter for screening for malignant neoplasm of cervix: Secondary | ICD-10-CM | POA: Insufficient documentation

## 2023-02-20 DIAGNOSIS — Z113 Encounter for screening for infections with a predominantly sexual mode of transmission: Secondary | ICD-10-CM

## 2023-02-20 DIAGNOSIS — N911 Secondary amenorrhea: Secondary | ICD-10-CM | POA: Insufficient documentation

## 2023-02-20 DIAGNOSIS — N852 Hypertrophy of uterus: Secondary | ICD-10-CM | POA: Diagnosis not present

## 2023-02-20 MED ORDER — MEDROXYPROGESTERONE ACETATE 10 MG PO TABS: 10.0000 mg | ORAL_TABLET | Freq: Every day | ORAL | 0 refills | Status: DC

## 2023-02-20 NOTE — Progress Notes (Signed)
Pt c/o amenorrhea for 3 months.  Pt reports positive UPT in October.  Negative UPTs anf HCG in office  Last PAP 10/2019  Requesting PAP and STD testing.

## 2023-02-20 NOTE — Progress Notes (Unsigned)
   GYNECOLOGY PROBLEM OFFICE VISIT NOTE  History:  Rachel Lucero is a 32 y.o. G1P1000 here today for amenorrhea.  Patient states that she has not had a cycle for 2 months.  She states she had a normal cycle in Sept then a positive home pregnancy test in Oct. She states she had bright red spotting for 2 days in November with cramping.   Patient reports that her typical menstrual cycle is 27-28 days lasting 4-5 days.  She denies cramping and reports flow is normal. Patient expresses desire for pregnancy.   Past Medical History:  Diagnosis Date   Taenia saginata infection 2016    Past Surgical History:  Procedure Laterality Date   NO PAST SURGERIES      The following portions of the patient's history were reviewed and updated as appropriate: allergies, current medications, past family history, past medical history, past social history, past surgical history and problem list.   Health Maintenance:  Normal pap w/o Cotest on 11/04/2019.  No mammogram on file d/t age.   Review of Systems:  Genito-Urinary ROS: no dysuria, trouble voiding, or hematuria Gastrointestinal ROS: negative Objective:  Vitals: BP 136/86   Pulse (!) 110   Wt 131 lb (59.4 kg)   LMP 12/08/2022 (Exact Date)   BMI 23.96 kg/m   Physical Exam: Physical Exam Constitutional:      Appearance: Normal appearance.  Genitourinary:     Genitourinary Comments: -Normal External Genitalia: Non tender, no apparent discharge at introitus.  -Vaginal Vault: Pink mucosa with good rugae. CV collected from posterior vault.  -Cervix:Difficult to visualize fully d/t position. Anterior aspect pink, no lesions, cysts, or polyps.  Pap collected with brush and spatula.  Bleeding noted after collection.  -Bimanual Exam: Uterine enlargement c/w [redacted]wk GA. Uterine movement minimal. Suspect multiple fibroids.   HENT:     Head: Normocephalic and atraumatic.  Eyes:     Conjunctiva/sclera: Conjunctivae normal.  Cardiovascular:     Rate  and Rhythm: Normal rate.  Pulmonary:     Effort: Pulmonary effort is normal. No respiratory distress.  Musculoskeletal:        General: Normal range of motion.     Cervical back: Normal range of motion.  Neurological:     Mental Status: She is alert and oriented to person, place, and time.  Skin:    General: Skin is warm and dry.  Psychiatric:        Mood and Affect: Mood normal.        Behavior: Behavior normal.  Vitals and nursing note reviewed. Exam conducted with a chaperone present Morrie Sheldon, RN).      Labs and Imaging: No results found.  Assessment & Plan:  32 year old Female Secondary Amenorrhea Pap Smear STD Screen Uterine Enlargement Language Barrier  -Extensive discussion regarding menstruation and amenorrhea. -Discussed progesterone trial and patient agreeable.  -Instructed to contact office if no bleeding occurs after completion of medications.  -If no bleeding occurs will proceed with labs and f/u with MD.  -Provera 10mg  sent to pharmacy on file.  -Exam performed and cultures collected. -Discussed enlarged uterus.  Patient denies h/o fibroids. -Will send for [place] to evaluate size. -Recommended  starting prenatal vitamins once evaluation of amenorrhea complete.  -Interpretations completed with assistance of video interpreter: Tigist: [id number].   Total face-to-face time with patient: 25 minutes   Gerrit Heck, [place] 02/20/2023 2:46 PM

## 2023-02-21 LAB — CERVICOVAGINAL ANCILLARY ONLY
Bacterial Vaginitis (gardnerella): NEGATIVE
Candida Glabrata: NEGATIVE
Candida Vaginitis: NEGATIVE
Chlamydia: NEGATIVE
Comment: NEGATIVE
Comment: NEGATIVE
Comment: NEGATIVE
Comment: NEGATIVE
Comment: NEGATIVE
Comment: NORMAL
Neisseria Gonorrhea: NEGATIVE
Trichomonas: NEGATIVE

## 2023-02-21 LAB — RPR: RPR Ser Ql: NONREACTIVE

## 2023-02-21 LAB — CYTOLOGY - PAP
Comment: NEGATIVE
Diagnosis: NEGATIVE
High risk HPV: NEGATIVE

## 2023-02-21 LAB — HEPATITIS B SURFACE ANTIGEN: Hepatitis B Surface Ag: NEGATIVE

## 2023-02-21 LAB — HIV ANTIBODY (ROUTINE TESTING W REFLEX): HIV Screen 4th Generation wRfx: NONREACTIVE

## 2023-02-21 LAB — HEPATITIS C ANTIBODY: Hep C Virus Ab: NONREACTIVE

## 2023-02-26 LAB — POCT URINE PREGNANCY: Preg Test, Ur: NEGATIVE

## 2023-02-27 ENCOUNTER — Ambulatory Visit (HOSPITAL_BASED_OUTPATIENT_CLINIC_OR_DEPARTMENT_OTHER)
Admission: RE | Admit: 2023-02-27 | Discharge: 2023-02-27 | Disposition: A | Discharge status: Home / Self Care | Payer: 59 | Source: Ambulatory Visit

## 2023-02-27 DIAGNOSIS — N911 Secondary amenorrhea: Secondary | ICD-10-CM | POA: Insufficient documentation

## 2023-03-05 ENCOUNTER — Telehealth: Payer: Self-pay

## 2023-03-05 DIAGNOSIS — N852 Hypertrophy of uterus: Secondary | ICD-10-CM

## 2023-03-05 NOTE — Telephone Encounter (Signed)
S/w pt via interpreter and advised of results and need for f/u ultrasound. Orders place, scheduler notified

## 2023-04-24 ENCOUNTER — Ambulatory Visit (HOSPITAL_BASED_OUTPATIENT_CLINIC_OR_DEPARTMENT_OTHER): Admission: RE | Admit: 2023-04-24 | Payer: 59 | Source: Ambulatory Visit

## 2023-04-27 ENCOUNTER — Encounter (HOSPITAL_COMMUNITY): Payer: Self-pay

## 2023-04-27 ENCOUNTER — Ambulatory Visit (HOSPITAL_COMMUNITY)
Admission: EM | Admit: 2023-04-27 | Discharge: 2023-04-27 | Disposition: A | Discharge status: Home / Self Care | Payer: 59 | Attending: Emergency Medicine | Admitting: Emergency Medicine

## 2023-04-27 DIAGNOSIS — R5383 Other fatigue: Secondary | ICD-10-CM

## 2023-04-27 DIAGNOSIS — R140 Abdominal distension (gaseous): Secondary | ICD-10-CM

## 2023-04-27 LAB — POCT URINE PREGNANCY: Preg Test, Ur: NEGATIVE

## 2023-04-27 NOTE — ED Provider Notes (Signed)
 [facility]    CSN: [id number] Arrival date & time: 04/27/23  1737      History   Chief Complaint No chief complaint on file.   HPI Rachel Lucero is a 33 y.o. female.   HPI     Patient has access to translation services. Translator   # [phone] Patient states she has been feeling tired, SOB and headaches for the past  SOB and fatigue has been ongoing for about a week. The headache started more recent She reports suprapubic abdominal pain that comes and goes. She states she does feel bloated and like there is pain in her LLQ   She denies nausea, vomiting diarrhea She denies recent bleeding or hx of anemia   She denies recent sick contacts  She denies previous hx of asthma or breathing trouble  She states she feels like something is moving in her stomach and she has not had her period since Sept  She is not on any form of birth control   She states her last bowel movement was 3 days ago - she states she does have a hx of constipation and has to strain to have a bowel movement   She states when she walks short distances she becomes tired and SOB    Past Medical History:  Diagnosis Date   Taenia saginata infection 2016    Patient Active Problem List   Diagnosis Date Noted   Otitis externa of left ear 01/02/2015   Taenia saginata infection 08/04/2014   Latent tuberculosis 08/04/2014   Vitamin D  deficiency 05/24/2014   Dizziness 05/23/2014   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 Authorizing Provider  medroxyPROGESTERone  (PROVERA ) 10 MG tablet Take 1 tablet (10 mg total) by mouth daily. Use for ten days 02/20/23   Synthia Raisin, CNM  naproxen  (NAPROSYN ) 500 MG tablet Take 1 tablet (500 mg total) by mouth 2 (two) times daily with a meal. Patient not taking: Reported on 02/20/2023 02/05/21   Christopher Savannah, PA-C  omeprazole  (PRILOSEC) 20 MG capsule Take 1 capsule (20 mg total) by mouth 2 (two) times daily before a meal for 14 days. 12/14/20 12/28/20  Theotis Haze ORN, NP    Family History Family History  Problem Relation Age of Onset   Diabetes Neg Hx    Hyperlipidemia Neg Hx     Social History Social History   Tobacco Use   Smoking status: Never   Smokeless tobacco: Never  Vaping Use   Vaping status: Never Used  Substance Use Topics   Alcohol use: No    Alcohol/week: 0.0 standard drinks of alcohol   Drug use: No     Allergies   Patient has  no known allergies.   Review of Systems Review of Systems  Constitutional:  Positive for fatigue. Negative for chills and fever.  HENT:  Negative for congestion, postnasal drip, rhinorrhea and sore throat.   Respiratory:  Positive for shortness of breath. Negative for cough.   Gastrointestinal:  Positive for abdominal pain. Negative for diarrhea, nausea and vomiting.  Genitourinary:  Negative for difficulty urinating, dysuria, vaginal bleeding, vaginal discharge and vaginal pain.  Neurological:  Positive for headaches.     Physical Exam Triage Vital Signs ED Triage Vitals  Encounter Vitals Group     BP 04/27/23 1755 (!) 142/89     Systolic BP Percentile --      Diastolic BP Percentile --      Pulse Rate 04/27/23 1755 (!) 101     Resp 04/27/23 1755 16     Temp 04/27/23 1755 97.9 F (36.6 C)     Temp Source 04/27/23 1755 Oral     SpO2 04/27/23 1755 98 %     Weight --      Height --      Head Circumference --      Peak Flow --      Pain Score 04/27/23 1757 4     Pain Loc --      Pain  Education --      Exclude from Growth Chart --    No data found.  Updated Vital Signs BP (!) 142/89 (BP Location: Left Arm)   Pulse (!) 101   Temp 97.9 F (36.6 C) (Oral)   Resp 16   LMP 12/11/2022 (Approximate)   SpO2 98%   Visual Acuity Right Eye Distance:   Left Eye Distance:   Bilateral Distance:    Right Eye Near:   Left Eye Near:    Bilateral Near:     Physical Exam Vitals reviewed.  Constitutional:      General: She is awake.     Appearance: Normal appearance. She is well-developed and well-groomed.  HENT:     Head: Normocephalic and atraumatic.  Cardiovascular:     Rate and Rhythm: Normal rate and regular rhythm.     Pulses: Normal pulses.          Radial pulses are 2+ on the right side and 2+ on the left side.     Heart sounds: Normal heart sounds. No murmur heard.    No friction rub. No gallop.  Pulmonary:     Effort: Pulmonary effort is normal.     Breath sounds: Normal breath sounds. No decreased air movement. No decreased breath sounds, wheezing, rhonchi or rales.  Abdominal:     General: Abdomen is flat. Bowel sounds are normal.     Palpations: Abdomen is soft.     Tenderness: There is no abdominal tenderness.     Hernia: There is no hernia in the umbilical area or ventral area.  Musculoskeletal:     Cervical back: Normal range of motion.     Right lower leg: No edema.     Left lower leg: No edema.  Neurological:     General: No focal deficit present.     Mental Status: She is alert and oriented to person, place, and time. Mental status is at baseline.     GCS: GCS eye subscore is 4. GCS verbal subscore is 5. GCS motor subscore is 6.  Psychiatric:        Attention and Perception: Attention and perception normal.  Mood and Affect: Mood and affect normal.        Speech: Speech normal.        Behavior: Behavior normal. Behavior is cooperative.        Thought Content: Thought content normal.        Cognition and Memory: Cognition normal.       UC Treatments / Results  Labs (all labs ordered are listed, but only abnormal results are displayed) Labs Reviewed  POCT URINE PREGNANCY    EKG   Radiology No results found.  Procedures Procedures (including critical care time)  Medications Ordered in UC Medications - No data to display  Initial Impression / Assessment and Plan / UC Course  I have reviewed the triage vital signs and the nursing notes.  Pertinent labs & imaging results that were available during my care of the patient were reviewed by me and considered in my medical decision making (see chart for details).      Final Clinical Impressions(s) / UC Diagnoses   Final diagnoses:  Fatigue, unspecified type  Abdominal bloating   Acute, new concern Patient reports that she has been having abdominal bloating, shortness of breath, fatigue for the past week.  She also reports a headache that started more recently.  Her vitals and physical exam are overall reassuring and normal.  We reviewed that her urine pregnancy test was negative.  Abdominal exam is reassuring.  At this time I suspect that she is probably constipated.  She states that she does not normally move her bowels but every 3 days and it is difficult to have a bowel movement.  Recommend using MiraLAX and stool softeners to facilitate a softer bowel movement.  Reviewed that she should follow-up with her PCP for ongoing fatigue as this is multifactorial and could be caused by lack of rest, dehydration, anemia, medication side effects.  Patient voiced understanding and agreement.  Follow-up as needed for progressing or persistent symptoms.  ED and return precautions were reviewed and provided in after visit summary and reviewed with her via translator at the end of appointment.    Discharge Instructions      At this time I suspect that your abdominal concerns are likely secondary to constipation.  I recommend using MiraLAX or stool softener to help  facilitate moving your bowels.  If you continue to have shortness of breath, fatigue I do recommend following up with your primary care provider.  Sometimes this can be signs of an underlying condition or it can be caused by medications or lack of sleep or rest.  If this continues or persist please follow-up with them to discuss further options.  Your physical exam and vitals were overall normal so I am less concerned for an acute or life-threatening issue tonight.  Please make sure that you are staying well-hydrated, eating regularly throughout the day, getting plenty of rest.  If you feel like your symptoms are getting worse or progressing please follow-up with your primary care or go to the emergency room for further evaluation and management     ED Prescriptions   None    PDMP not reviewed this encounter.   Marylene Rocky BRAVO, PA-C 04/27/23 1912

## 2023-04-27 NOTE — Discharge Instructions (Addendum)
 At this time I suspect that your abdominal concerns are likely secondary to constipation.  I recommend using MiraLAX or stool softener to help facilitate moving your bowels.  If you continue to have shortness of breath, fatigue I do recommend following up with your primary care provider.  Sometimes this can be signs of an underlying condition or it can be caused by medications or lack of sleep or rest.  If this continues or persist please follow-up with them to discuss further options.  Your physical exam and vitals were overall normal so I am less concerned for an acute or life-threatening issue tonight.  Please make sure that you are staying well-hydrated, eating regularly throughout the day, getting plenty of rest.  If you feel like your symptoms are getting worse or progressing please follow-up with your primary care or go to the emergency room for further evaluation and management

## 2023-04-27 NOTE — ED Triage Notes (Addendum)
 Per interpreter- Patient c/o  nausea, headache, and SOB x 2 days and fatigue x 1 week.  Patient added that she has not had her period since September and states that she recently has had abdominal bloating.  Patient has not taken any medications for her symptoms.

## 2023-04-30 ENCOUNTER — Ambulatory Visit (HOSPITAL_BASED_OUTPATIENT_CLINIC_OR_DEPARTMENT_OTHER)
Admission: RE | Admit: 2023-04-30 | Discharge: 2023-04-30 | Disposition: A | Discharge status: Home / Self Care | Payer: 59 | Source: Ambulatory Visit

## 2023-04-30 DIAGNOSIS — N852 Hypertrophy of uterus: Secondary | ICD-10-CM | POA: Diagnosis present

## 2023-05-02 DIAGNOSIS — D259 Leiomyoma of uterus, unspecified: Secondary | ICD-10-CM | POA: Insufficient documentation

## 2023-05-22 ENCOUNTER — Ambulatory Visit: Payer: 59 | Admitting: Obstetrics and Gynecology

## 2023-05-27 ENCOUNTER — Ambulatory Visit: Admitting: Obstetrics and Gynecology

## 2023-06-17 NOTE — Progress Notes (Unsigned)
    GYNECOLOGY VISIT  Patient name: Chevelle Coulson MRN [id number]  Date of birth: 06/25/1990 Chief Complaint:   No chief complaint on file.   History:  Nelda Luckey is a 33 y.o. G1P1000 being seen today for ***.    Past Medical History:  Diagnosis Date   Taenia saginata infection 2016    Past Surgical History:  Procedure Laterality Date   NO PAST SURGERIES      The following portions of the patient's history were reviewed and updated as appropriate: allergies, current medications, past family history, past medical history, past social history, past surgical history and problem list.   Health Maintenance:   Last pap ***. Results were: {Pap findings:25134}. H/O abnormal pap: {yes/yes***/no:23866} Last mammogram: ***. Results were: {normal, abnormal, n/a:23837}. Family h/o breast cancer: {yes***/no:23838}   Review of Systems:  {Ros - complete:30496} Comprehensive review of systems was otherwise negative.   Objective:  Physical Exam There were no vitals taken for this visit.   Physical Exam   Labs and Imaging FINDINGS: The uterus is anteverted in position and measures 14.6 x 8.7 x 7.5 cm. It demonstrates multiple fibroids with the largest, fundal subserosal, measuring 4.4 x 4.3 cm. The endometrium measures 1.5 cm and demonstrates a normal homogeneous echotexture.   The right ovary measures 3.6 x 3.4 x 2.4 cm and demonstrates a dominant follicle measuring 2.7 cm. There is normal color Doppler flow.   The left ovary is not visualized.   There is no fluid present within the cul-de-sac.   IMPRESSION: 1. Enlarged heterogeneous uterus with multiple fibroids. The largest fibroid measures 4.4 cm.   2.  Nonvisualization of the left ovary.   Thank you for allowing [place] to assist in the care of this patient.     Electronically Signed   By: Lestine Box M.D.   On: 04/30/2023 21:19      Assessment & Plan:   There are no diagnoses linked to this  encounter.   *** Routine preventative health maintenance measures emphasized.  Lorriane Shire, MD [facility] for [facility], [facility]

## 2023-06-18 ENCOUNTER — Encounter: Payer: Self-pay | Admitting: Obstetrics and Gynecology

## 2023-06-18 ENCOUNTER — Ambulatory Visit: Admitting: Obstetrics and Gynecology

## 2023-06-18 VITALS — BP 130/88 | HR 94 | Ht 63.0 in | Wt 133.0 lb

## 2023-06-18 DIAGNOSIS — N852 Hypertrophy of uterus: Secondary | ICD-10-CM | POA: Diagnosis not present

## 2023-06-18 DIAGNOSIS — Z758 Other problems related to medical facilities and other health care: Secondary | ICD-10-CM

## 2023-06-18 DIAGNOSIS — Z603 Acculturation difficulty: Secondary | ICD-10-CM | POA: Diagnosis not present

## 2023-06-18 DIAGNOSIS — N914 Secondary oligomenorrhea: Secondary | ICD-10-CM

## 2023-06-20 MED ORDER — ESTRADIOL 1 MG PO TABS: 1.0000 mg | ORAL_TABLET | Freq: Every day | ORAL | 1 refills | Status: AC

## 2023-06-20 MED ORDER — MEDROXYPROGESTERONE ACETATE 10 MG PO TABS: 10.0000 mg | ORAL_TABLET | Freq: Every day | ORAL | 0 refills | Status: AC

## 2023-06-24 LAB — HEMOGLOBIN A1C
Est. average glucose Bld gHb Est-mCnc: 114 mg/dL
Hgb A1c MFr Bld: 5.6 % (ref 4.8–5.6)

## 2023-06-24 LAB — TESTOSTERONE,FREE AND TOTAL
Testosterone, Free: 0.2 pg/mL (ref 0.0–4.2)
Testosterone: 8 ng/dL (ref 8–60)

## 2023-06-24 LAB — PROLACTIN: Prolactin: 17.9 ng/mL (ref 4.8–33.4)

## 2023-06-24 LAB — TSH RFX ON ABNORMAL TO FREE T4: TSH: 1.17 u[IU]/mL (ref 0.450–4.500)
